# Patient Record
Sex: Female | Born: 1960 | Race: White | Hispanic: No | Marital: Married | Smoking: Never smoker
Health system: Southern US, Community
[De-identification: ages and names within clinical notes are randomized; demographics above are authoritative.]

## PROBLEM LIST (undated history)

## (undated) DIAGNOSIS — Q246 Congenital heart block: Secondary | ICD-10-CM

## (undated) DIAGNOSIS — Z950 Presence of cardiac pacemaker: Secondary | ICD-10-CM

## (undated) DIAGNOSIS — E039 Hypothyroidism, unspecified: Secondary | ICD-10-CM

## (undated) DIAGNOSIS — J45909 Unspecified asthma, uncomplicated: Secondary | ICD-10-CM

## (undated) DIAGNOSIS — I10 Essential (primary) hypertension: Secondary | ICD-10-CM

## (undated) DIAGNOSIS — I493 Ventricular premature depolarization: Secondary | ICD-10-CM

## (undated) HISTORY — DX: Unspecified asthma, uncomplicated: J45.909

## (undated) HISTORY — DX: Congenital heart block: Q24.6

## (undated) HISTORY — DX: Presence of cardiac pacemaker: Z95.0

## (undated) HISTORY — DX: Ventricular premature depolarization: I49.3

## (undated) HISTORY — PX: INSERT / REPLACE / REMOVE PACEMAKER: SUR710

## (undated) HISTORY — DX: Hypothyroidism, unspecified: E03.9

## (undated) HISTORY — DX: Essential (primary) hypertension: I10

---
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
## 2001-07-14 ENCOUNTER — Other Ambulatory Visit: Admission: RE | Admit: 2001-07-14 | Discharge: 2001-07-14 | Payer: Self-pay | Admitting: Internal Medicine

## 2001-08-22 ENCOUNTER — Ambulatory Visit (HOSPITAL_COMMUNITY): Admission: RE | Admit: 2001-08-22 | Discharge: 2001-08-23 | Payer: Self-pay | Admitting: Cardiology

## 2001-08-22 ENCOUNTER — Encounter: Payer: Self-pay | Admitting: *Deleted

## 2004-08-18 ENCOUNTER — Inpatient Hospital Stay (HOSPITAL_COMMUNITY): Admission: EM | Admit: 2004-08-18 | Discharge: 2004-08-22 | Payer: Self-pay | Admitting: Emergency Medicine

## 2004-08-18 ENCOUNTER — Ambulatory Visit: Payer: Self-pay | Admitting: Internal Medicine

## 2004-08-21 ENCOUNTER — Encounter: Payer: Self-pay | Admitting: Internal Medicine

## 2004-08-21 ENCOUNTER — Encounter (INDEPENDENT_AMBULATORY_CARE_PROVIDER_SITE_OTHER): Payer: Self-pay | Admitting: *Deleted

## 2004-09-14 ENCOUNTER — Encounter: Admission: RE | Admit: 2004-09-14 | Discharge: 2004-09-14 | Payer: Self-pay

## 2004-09-26 ENCOUNTER — Ambulatory Visit: Payer: Self-pay | Admitting: Internal Medicine

## 2004-10-19 ENCOUNTER — Ambulatory Visit (HOSPITAL_COMMUNITY): Admission: RE | Admit: 2004-10-19 | Discharge: 2004-10-19 | Payer: Self-pay

## 2004-11-01 ENCOUNTER — Ambulatory Visit: Payer: Self-pay | Admitting: Hematology & Oncology

## 2004-11-06 ENCOUNTER — Ambulatory Visit: Payer: Self-pay | Admitting: Internal Medicine

## 2004-11-21 ENCOUNTER — Encounter (INDEPENDENT_AMBULATORY_CARE_PROVIDER_SITE_OTHER): Payer: Self-pay | Admitting: Specialist

## 2004-11-21 ENCOUNTER — Encounter: Payer: Self-pay | Admitting: Hematology & Oncology

## 2004-11-21 ENCOUNTER — Ambulatory Visit (HOSPITAL_COMMUNITY): Admission: RE | Admit: 2004-11-21 | Discharge: 2004-11-21 | Payer: Self-pay | Admitting: Hematology & Oncology

## 2004-11-27 ENCOUNTER — Ambulatory Visit: Payer: Self-pay | Admitting: Hematology & Oncology

## 2004-12-26 ENCOUNTER — Ambulatory Visit: Payer: Self-pay | Admitting: Internal Medicine

## 2005-05-03 ENCOUNTER — Ambulatory Visit: Payer: Self-pay | Admitting: Internal Medicine

## 2005-05-08 ENCOUNTER — Ambulatory Visit (HOSPITAL_COMMUNITY): Admission: RE | Admit: 2005-05-08 | Discharge: 2005-05-08 | Payer: Self-pay | Admitting: Internal Medicine

## 2005-05-10 ENCOUNTER — Ambulatory Visit: Payer: Self-pay | Admitting: Internal Medicine

## 2005-06-28 ENCOUNTER — Inpatient Hospital Stay (HOSPITAL_COMMUNITY): Admission: AD | Admit: 2005-06-28 | Discharge: 2005-07-06 | Payer: Self-pay | Admitting: Internal Medicine

## 2005-06-28 ENCOUNTER — Ambulatory Visit: Payer: Self-pay | Admitting: Internal Medicine

## 2005-07-02 ENCOUNTER — Encounter (INDEPENDENT_AMBULATORY_CARE_PROVIDER_SITE_OTHER): Payer: Self-pay | Admitting: Cardiology

## 2005-07-02 ENCOUNTER — Encounter: Payer: Self-pay | Admitting: Internal Medicine

## 2005-07-10 ENCOUNTER — Ambulatory Visit: Payer: Self-pay | Admitting: Internal Medicine

## 2005-07-19 ENCOUNTER — Ambulatory Visit: Payer: Self-pay | Admitting: Internal Medicine

## 2005-07-22 ENCOUNTER — Inpatient Hospital Stay (HOSPITAL_COMMUNITY): Admission: EM | Admit: 2005-07-22 | Discharge: 2005-07-24 | Payer: Self-pay | Admitting: Emergency Medicine

## 2005-08-08 ENCOUNTER — Emergency Department (HOSPITAL_COMMUNITY): Admission: EM | Admit: 2005-08-08 | Discharge: 2005-08-08 | Payer: Self-pay | Admitting: Emergency Medicine

## 2005-08-10 ENCOUNTER — Inpatient Hospital Stay (HOSPITAL_COMMUNITY): Admission: RE | Admit: 2005-08-10 | Discharge: 2005-08-15 | Payer: Self-pay | Admitting: Surgery

## 2005-08-13 ENCOUNTER — Ambulatory Visit: Payer: Self-pay | Admitting: Infectious Diseases

## 2005-08-20 ENCOUNTER — Ambulatory Visit: Payer: Self-pay | Admitting: Internal Medicine

## 2005-09-03 ENCOUNTER — Ambulatory Visit: Payer: Self-pay | Admitting: Internal Medicine

## 2005-09-04 ENCOUNTER — Encounter: Admission: RE | Admit: 2005-09-04 | Discharge: 2005-09-04 | Payer: Self-pay | Admitting: Surgery

## 2005-09-10 ENCOUNTER — Ambulatory Visit: Payer: Self-pay | Admitting: Internal Medicine

## 2005-09-27 ENCOUNTER — Observation Stay (HOSPITAL_COMMUNITY): Admission: EM | Admit: 2005-09-27 | Discharge: 2005-09-28 | Payer: Self-pay | Admitting: Emergency Medicine

## 2007-01-19 IMAGING — CR DG CHEST 2V
2 series · 2 of 2 positions shown · non-contrast
Comparison: none

CLINICAL DATA: Fever.  Asthma.  Cough.
 CHEST - 2 VIEWS:   
 Two views of the chest are compared to a chest x-ray of 08/22/01.  The lungs are clear.   There is some peribronchial thickening consistent with bronchitis.  The heart is mildly enlarged and a permanent pacemaker remains.  Disconnected epicardial leads remain.

[w chest pa]
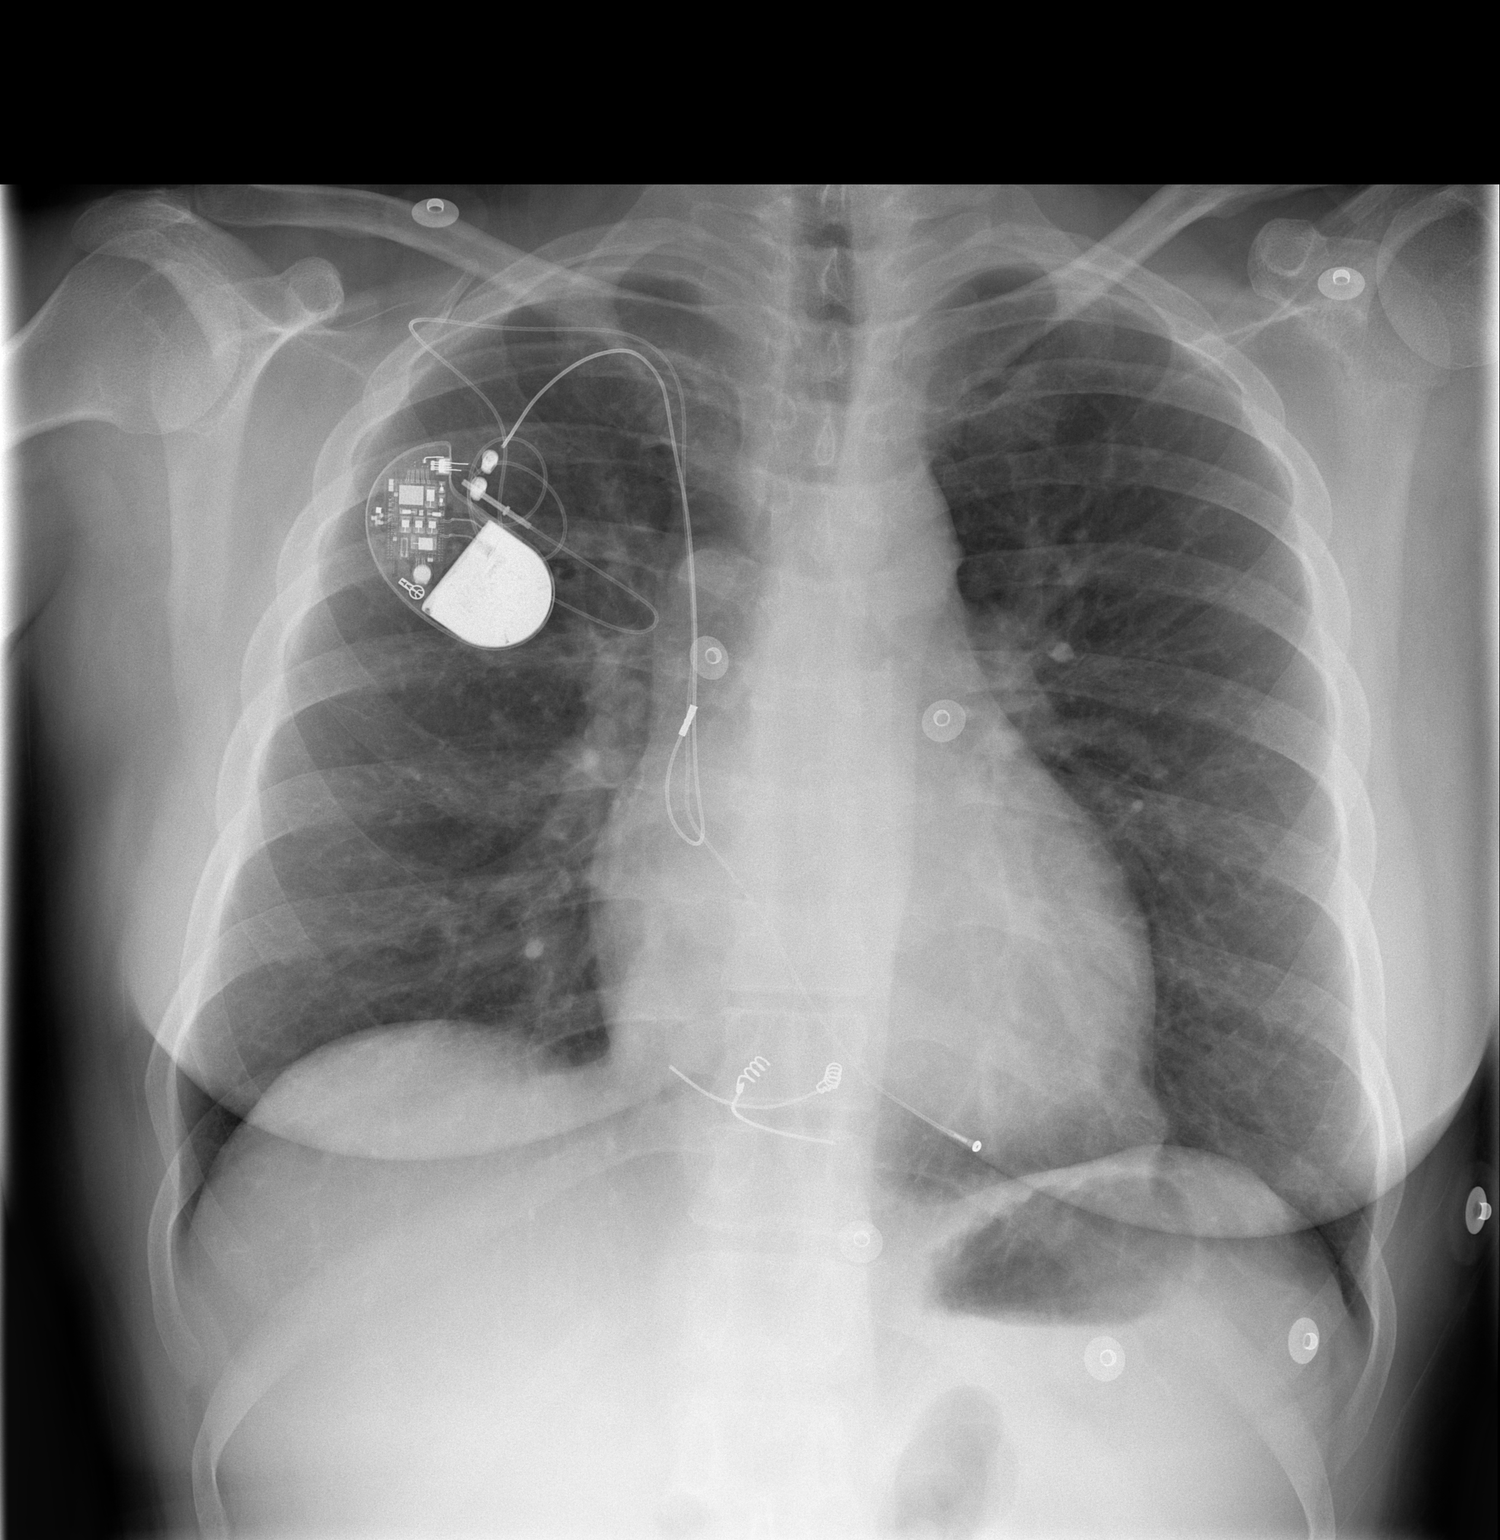

[w chest lat]
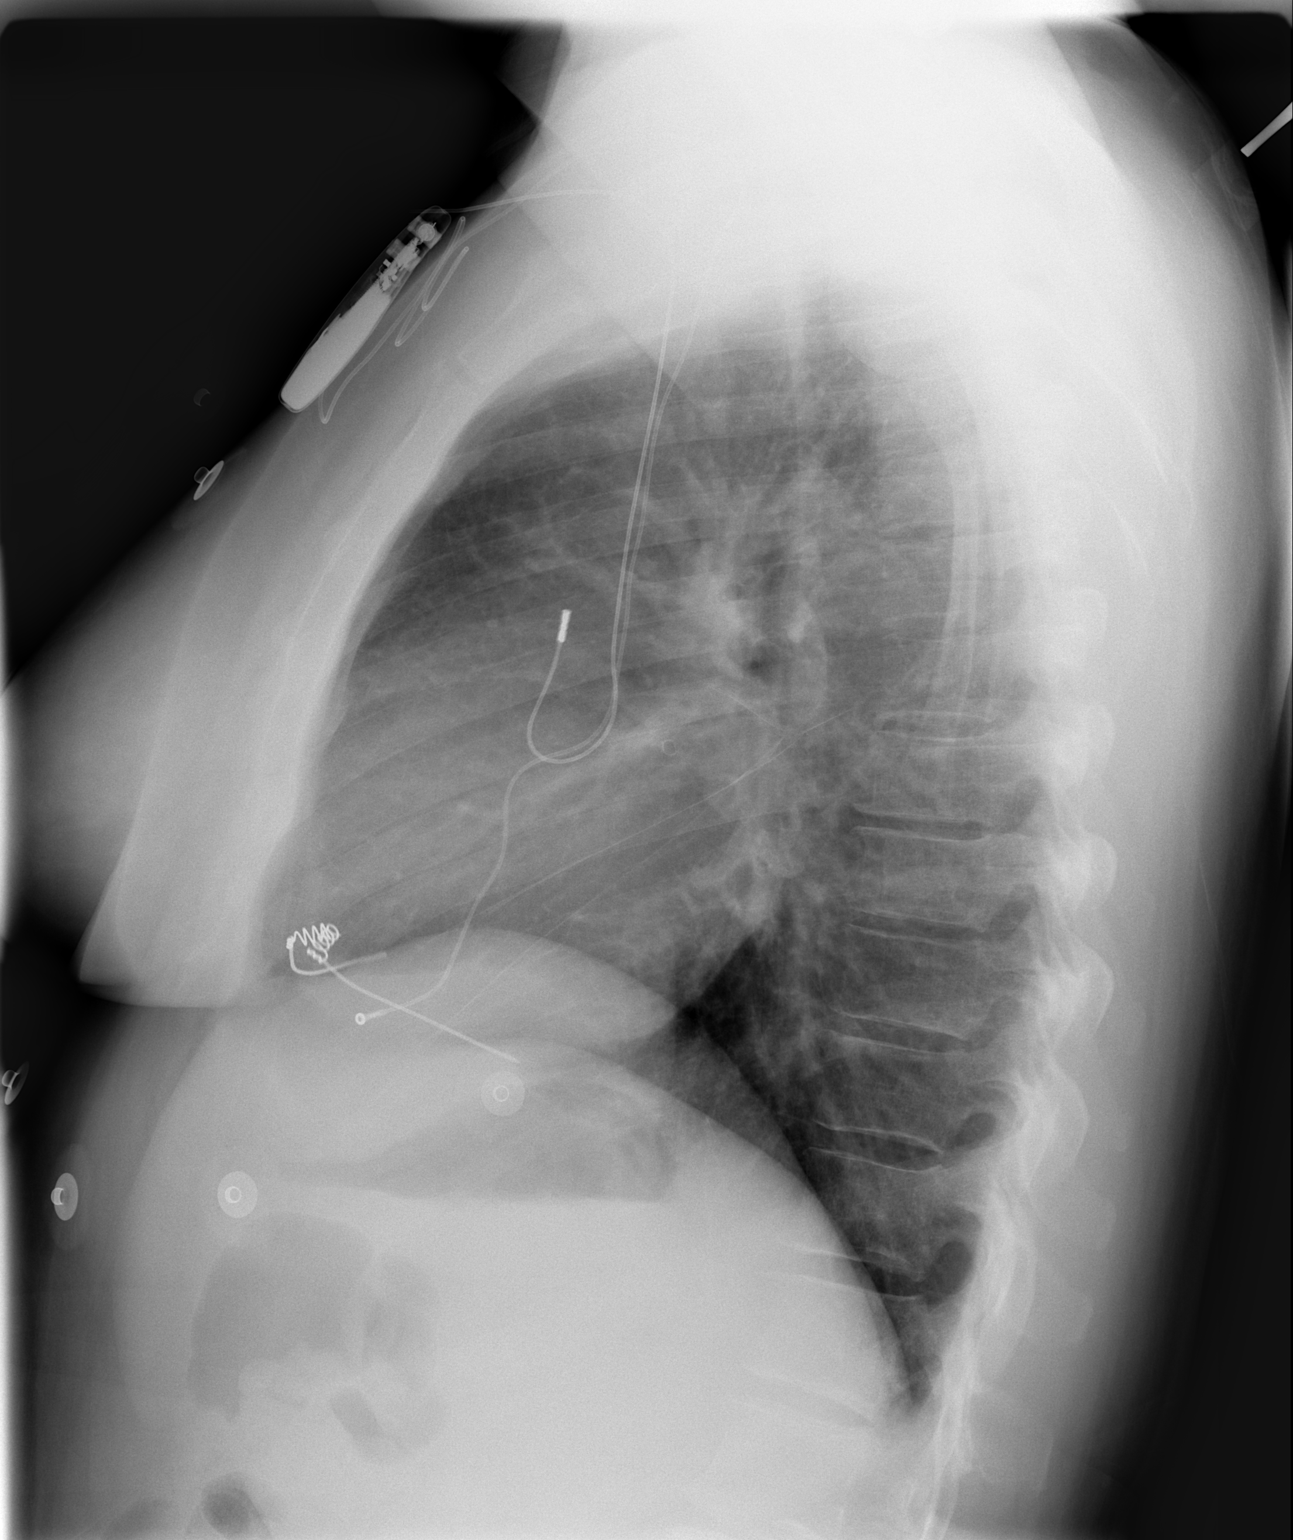

[2 of 2 positions shown; findings below may reference images not displayed]

IMPRESSION: No active lung disease.   Question bronchitis.  Permanent pacer remains.

## 2007-01-22 IMAGING — US US ABDOMEN LIMITED
1 series · 5 of 5 positions shown · non-contrast
Comparison: none

CLINICAL DATA: Fever.  Palpable spleen.  
 LIMITED ABDOMEN ULTRASOUND:
TECHNIQUE: Limited abdominal ultrasound examination was performed to evaluate the spleen. 
 Multiple scans of the spleen show the spleen to measure 13.0 x 8.3 x 8.6 cm which corresponds to a volume of 486 cubic centimeters which is at the upper limits of normal and a higher volume than is usually seen with the spleen since the spleen is more spherical in contour than is usually seen.  No definite mass or irregularity of transmission of sound is seen within the spleen.   There are no granulomata present.

[Series 1: unknown · 0.33mm/px · 5 of 5 slices shown]
[im 1/5]
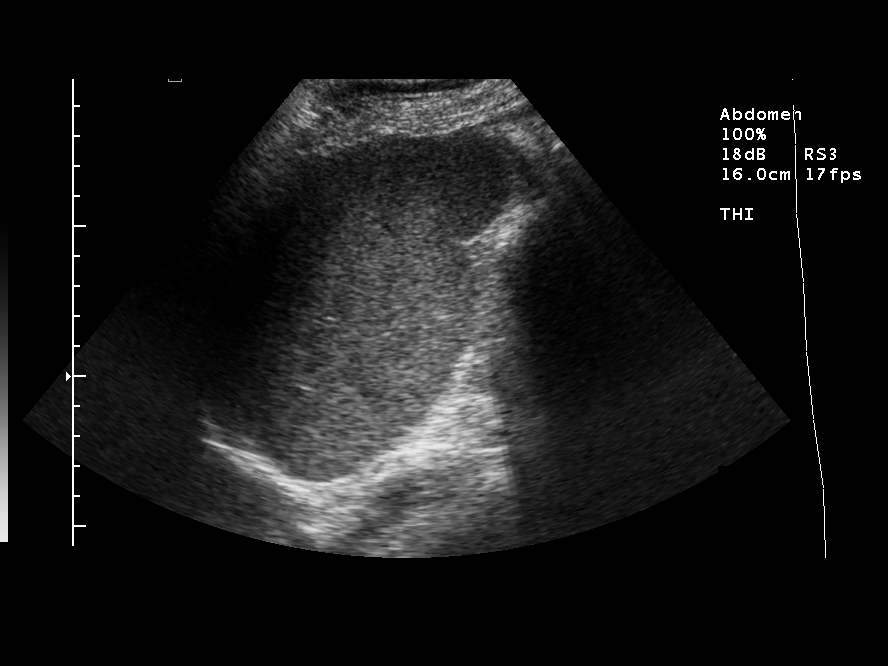
[im 2/5]
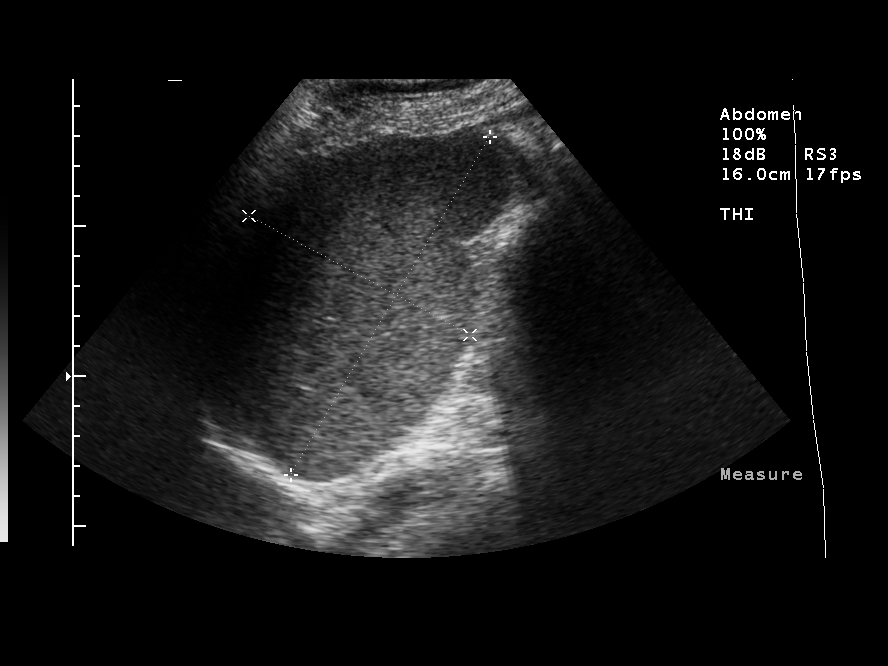
[im 3/5]
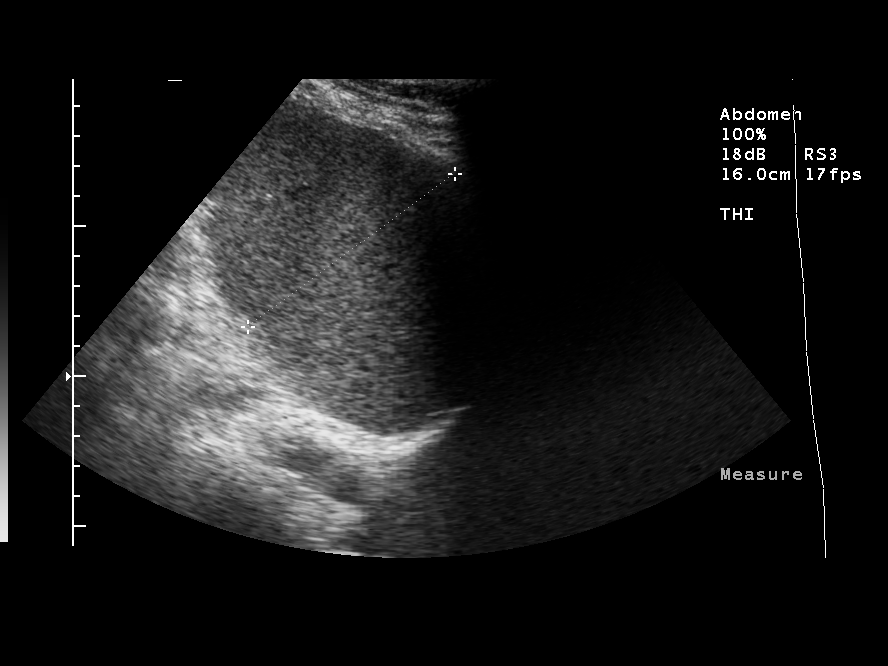
[im 4/5]
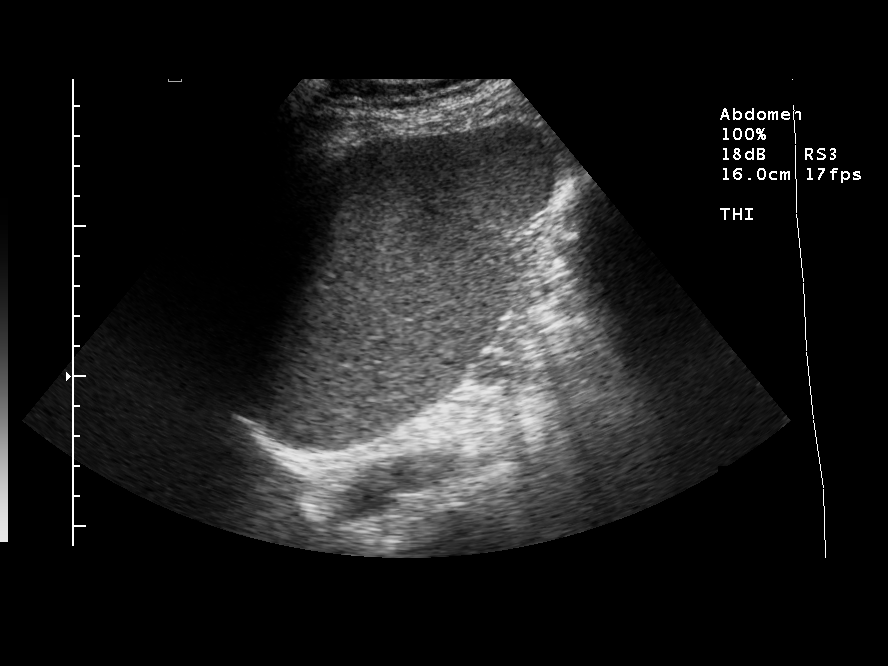
[im 5/5]
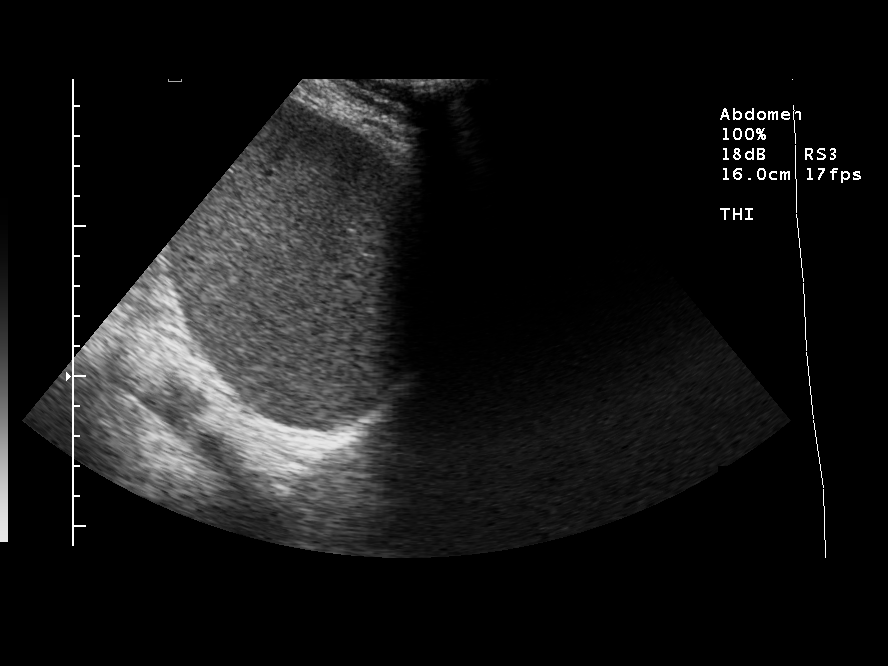

[5 of 5 positions shown; findings below may reference images not displayed]

IMPRESSION: Spleen at the upper limits of normal for size.  No focal abnormality within the spleen.

## 2009-03-16 ENCOUNTER — Encounter: Payer: Self-pay | Admitting: Internal Medicine

## 2009-08-02 ENCOUNTER — Encounter: Payer: Self-pay | Admitting: Internal Medicine

## 2009-10-04 ENCOUNTER — Telehealth (INDEPENDENT_AMBULATORY_CARE_PROVIDER_SITE_OTHER): Payer: Self-pay | Admitting: *Deleted

## 2009-10-06 ENCOUNTER — Ambulatory Visit: Payer: Self-pay | Admitting: Internal Medicine

## 2009-10-06 DIAGNOSIS — I4949 Other premature depolarization: Secondary | ICD-10-CM | POA: Insufficient documentation

## 2009-10-06 DIAGNOSIS — Z950 Presence of cardiac pacemaker: Secondary | ICD-10-CM

## 2009-10-06 DIAGNOSIS — E039 Hypothyroidism, unspecified: Secondary | ICD-10-CM | POA: Insufficient documentation

## 2010-03-16 NOTE — Progress Notes (Signed)
  Pt Records recieved from [facility] gave to [place]. Cala Bradford Mesiemore  October 04, 2009 8:13 AM

## 2010-03-16 NOTE — Letter (Signed)
Summary: [facility] Extended Visit   [facility] Extended Visit   Imported By: Roderic Ovens 01/27/2010 16:14:52  _____________________________________________________________________  External Attachment:    Type:   Image     Comment:   External Document

## 2010-03-16 NOTE — Letter (Signed)
Summary: Echo, Ekg, Cath Report   Echo, Ekg, Cath Report   Imported By: Roderic Ovens 01/27/2010 16:18:22  _____________________________________________________________________  External Attachment:    Type:   Image     Comment:   External Document

## 2010-03-16 NOTE — Letter (Signed)
Summary: [facility]/Revision Data Sheet   [facility]/Revision Data Sheet   Imported By: Roderic Ovens 01/27/2010 16:13:12  _____________________________________________________________________  External Attachment:    Type:   Image     Comment:   External Document

## 2010-03-16 NOTE — Cardiovascular Report (Signed)
Summary: Office Visit   Office Visit   Imported By: Roderic Ovens 10/11/2009 16:31:00  _____________________________________________________________________  External Attachment:    Type:   Image     Comment:   External Document

## 2010-03-16 NOTE — Assessment & Plan Note (Signed)
Summary: pacemaker/hypertension/mt   Visit Type:  Initial Consult Primary [name]:  Candida Peeling, MD   History of Present Illness: [name] returns today for followup.  She is a pleasant 50 yo woman with congenital complete heart block s/p PPM.  The patient developed a PM infection (endovascular) several yrs ago which was initially very difficult to diagnose.  She ultimately underwent lead extraction and insertion of a new device with epicardial leads.  She has been stable in the interim except that she has some pain at her PPM insertion site.  No fever/chills/night sweats.  Current Medications (verified): 1)  Synthroid 88 Mcg Tabs (Levothyroxine Sodium) .... Once Daily 2)  Metoprolol Tartrate 100 Mg Tabs (Metoprolol Tartrate) .... Take 1/2tablet By Mouth Once Daily 3)  Keflex 500 Mg Caps (Cephalexin) .... As Needed Dental Work  Allergies (verified): 1)  ! * Pcn 2)  ! Erythromycin 3)  ! Doxycycline 4)  ! Zithromax 5)  ! * Anspor 6)  ! Septra  Past History:  Past Medical History: Last updated: 10/06/2009 Current Problems:  PACEMAKER, PERMANENT (ICD-V45.01) PREMATURE VENTRICULAR CONTRACTIONS (ICD-427.69) UNSPECIFIED HYPOTHYROIDISM (ICD-244.9) COPD (ICD-496) CAD (ICD-414.00) CORONARY ARTERY BYPASS GRAFT, HX OF (ICD-V45.81)    Review of Systems  The patient denies chest pain, syncope, dyspnea on exertion, and peripheral edema.    Vital Signs:  Patient profile:   50 year old female Height:      53 inches Weight:      190 pounds BMI:     47.73 Pulse rate:   72 / minute BP sitting:   138 / 84  (left arm)  Vitals Entered By: Laurance Flatten CMA (October 06, 2009 1:55 PM)  Physical Exam  General:  Well developed, well nourished, in no acute distress.  HEENT: normal Neck: supple. No JVD. Carotids 2+ bilaterally no bruits Cor: RRR no rubs, gallops or murmur Lungs: CTA. Well healed sternotomy incision. Ab: soft, nontender. nondistended. No HSM. Good bowel sounds. Well healed  PM incision. Ext: warm. no cyanosis, clubbing or edema Neuro: alert and oriented. Grossly nonfocal. affect pleasant    PPM Specifications Following MD:  [name], MD     PPM Vendor:  [employer]     PPM Model Number:  [id number]     PPM Serial Number:  [id number] PPM DOI:  08/10/2005      Lead 1    Location: RA     DOI: 08/10/2005     Model #: [id number]     Serial #: [id number]     Status: active Lead 2    Location: RV     DOI: 08/10/2005     Model #: [id number]     Serial #: [id number]     Status: active   Indications:  abd implant   PPM Follow Up Battery Voltage:  2.78 V     Battery Est. Longevity:  2-2.75 YRS     Pacer Dependent:  Yes       PPM Device Measurements Atrium  Amplitude: 1.1 mV, Impedance: 308 ohms, Threshold: 0.50 V at 0.4 msec Right Ventricle  Amplitude: PACED 368 mV, Impedance: 368 ohms, Threshold: 1.25 V at 0.4 msec  Episodes MS Episodes:  238     Percent Mode Switch:  <1%     Ventricular High Rate:  0     Atrial Pacing:  58%     Ventricular Pacing:  >99%  Parameters Mode:  DDDR     Lower Rate Limit:  70     Upper  Rate Limit:  130 Paced AV Delay:  170     Sensed AV Delay:  150 Next Cardiology Appt Due:  03/15/2010 Tech Comments:  PACER DEPENDENT---NORMAL DEVICE FUNCTION.  NO CHANGES MADE.  ROV IN 6 MTHS W/DEVICE CLINIC. Vella Kohler  October 06, 2009 2:21 PM MD Comments:  agree with above.  Impression & Recommendations:  Problem # 1:  PACEMAKER, PERMANENT (ICD-V45.01) Her device is working normally.  Her RV threshold is elevated a bit.  Will follow.  Patient Instructions: 1)  Your physician recommends that you schedule a follow-up appointment in: 6 months with Dr [name]

## 2010-06-30 NOTE — H&P (Signed)
NAMEDAYSHIA, [name]              ACCOUNT NO.:  [id number]   MEDICAL RECORD NO.:  [id number]          PATIENT TYPE:  INP   LOCATION:  1824                         FACILITY:  [facility]   PHYSICIAN:  Isidor Holts, M.D.  DATE OF BIRTH:  [birth year]   DATE OF ADMISSION:  08/18/2004  DATE OF DISCHARGE:                                HISTORY & PHYSICAL   PRIMARY MEDICAL DOCTOR:  Dr. Gardiner Barefoot. Renne Crigler   CHIEF COMPLAINT:  Recurrent fever/chills for approximately 1 week.   HISTORY OF PRESENT ILLNESS:  This is a 50 year old female, with known  history of hypothyroidism, congenital heart block. Status post multiple  procedures for fractured/failed pacer.  She had fever and chills while at a  meeting on 08/09/2004.  According to her, she became short of breath and  turned blue.  The episode passed.  Since then she has had daily episodes  of shaking chills and fever associated with shortness of breath and cough  productive of phlegm, although she does not know the color of the phlegm as  she swallows it.  On 08/13/2004 [facility]  where she was evaluated by Dr. Merla Riches.  She had, according to her, a full  work-up including urinalysis, chest x-ray, blood cultures and apparently had  a temperature of 102.7 at that time.  Results of the above investigations  are still pending. Renal ultrasound was arranged, as the patient's  urinalysis apparently showed hematuria.  This was done on 08/17/2004, but  the results are not known.  The patient presented to her PMD's office on  08/17/2004 due to persisting symptoms. She was seen by the nurse  practitioner and apparently a TEE was arranged.  This has not yet been done.  She finally went to [facility] on 08/17/2004 where she  was seen by Dr. Renne Crigler and was referred for admission, workup, and treatment.  On detailed questioning, according to the patient, way back in April 2006  she had a cold which resolved  subsequently soon after.  While in [place],  about that time, she had her first chill.  This also soon resolved.  In  May 2006 she had dental work, which involved placing a crown and at that  time, she was placed on antibiotics, for some unclear reason, possibly for  endocarditis prophylaxis and had further chills.  Work-up was done  subsequently by Dr. Adelene Idler nurse practitione, and, according to the  patient, urinalysis showed microhematuria, but as she had started her menses  2 days prior, this was felt to be secondary to contamination. She has had no  further problems or episodes of fever or chills until 08/09/2004.   PAST MEDICAL HISTORY:  1.  Chronic sinus problems associated with nasal congestion, post nasal      drip.  2.  History of congenital heart block, status post permanent pacer at age      13+years.  Pacer leads were fractured/replaced 08/1976 (required 3 procedures).  Pacer was replaced in 1982 by Drs. Tysinger/Bowman.  The patient is status post removal of old pacer wires, 08/1981.  Failed pacer 1990. Battery was changed.  Failed atrial pacer lead in 2003, this was removed.  The patient now has  ventricular pacing.  1.  Bronchial asthma, mild intermittent.  2.  Hypothyroidism in 1991 now on replacement treatment.  3.  Status post tonsillectomy in 1971.   MEDICATIONS:  1.  Synthroid 88 mcg alternating with 100 mcg.  2.  Antacids over-the-counter.   ALLERGIES:  The patient has multiple antibiotic allergies including  ERYTHROMYCIN, PENICILLIN, DOXYCYCLINE, ANSPOR, SEPTRA.  She, however, tolerates Keflex.   SYSTEMS REVIEW:  Essentially as in the HPI and chief complaint. She gets  occasional leg cramps.   SOCIAL HISTORY:  Otherwise negative.  LMP 08/03/2004.   FAMILY HISTORY:  The patient is married, is a nonsmoker, nondrinker. Has no  history of drug abuse. She has half-siblings, but she does not know what  their health status is and she also does not know her  father's health  history.  Her mother is living and has had an MI prior to age 39 years.  She  is hypertensive, has renal problems, and is obese.  Family history is  otherwise noncontributory.   PHYSICAL EXAMINATION:  VITAL SIGNS:  Temperature 101.3, pulse 70/min,  regular; respiratory rate 24, blood pressure 140/60 mmHg.  Pulse oximetry  98% on room air.  GENERAL:  The patient does not appear to be in obvious acute distress.  She  is alert, acutely short of breath at rest.  HEENT:  No clinical pallor, no jaundice, no conjunctival injection. Throat  is quite clear.  The patient has tenderness to pressure over the maxillary  sinuses bilaterally.  NECK:  Supple.  JVP not seen. No palpable lymphadenopathy, no palpable  goiter.  CHEST:  Clinically clear to auscultation. No wheezes, no crackles.  HEART:  Sounds normal, regular, no murmurs.  Pacer is noted in right  infraclavicular pouch with overlying scars.  ABDOMEN:  Full, soft and nontender.  There is no palpable organomegaly, no  palpable masses, normal bowel sounds.  EXTREMITIES:  Lower extremity examination no pitting edema or palpable  peripheral pulses.   INVESTIGATIONS:  CBC showed WBC 11.1, hemoglobin 10.6, hematocrit 32.2,  platelets 195.  Electrolytes sodium 135, potassium 3.7, chloride 109, CO2  24, BUN 8, creatinine 0.9, glucose 95.  Urinalysis shows rbc's 3-6, negative  leukocyte esterase, negative nitrites, wbc's not done, bacteria many.  Chest  x-ray shows probable bronchitic changes.  Permanent pacer is in situ,  otherwise no abnormality is seen.  EKG shows paced complexes.   ASSESSMENT AND PLAN:  1.  Pyrexia query cause.  The patient has chronic sinus problems, maxillary      sinus tenderness, occasional cough with phlegm production.  Chest x-ray      shows bronchitic changes.  Urinalysis shows microhematuria with     bacteruria.  These may be the likely cause of the patient's pyrexia and      chills over the past 1  week.  The 2 episodes of chills with and without      fever in April-May 2006 may well be a red herring.  We will send out the      blood cultures x3, do a paranasal sinus CT scan for completeness and      also a chest CT to rule out pulmonary embolism.  Meanwhile we will start      the patient on Avelox which should be adequate coverage for respiratory      tract infection, including sinusitis  and also urinary tract infection.      Dr. Renne Crigler feels that endocarditis should be a consideration given the      patient's background history. Certainly, this possibility has to be      entertained.  We will, therefore, contact Dr. Aleen Campi for possible TEE      particularly, as I understand that this may already have been arranged.      Dr. Adelene Idler office has been contacted and consultation requested      accordingly.   1.  History of congestive heart block status post permanent pacer, per EKG      this seems to be functioning quite adequately.  We shall continue to      monitor.   1.  History of mild intermittent bronchial asthma.  This is not troublesome      at present.  We will manage her with p.r.n. bronchodilator nebulizers.   1.  Hypothyroidism. We will check TSH levels and continue replacement      treatment with pre-admission synthroid dosage.  Further management will depend on clinical course.       CO/MEDQ  D:  08/18/2004  T:  08/18/2004  Job:  [id number]   cc:   Antionette Char, MD  [address]  [place]  [place] [address]  Fax: [phone]   [facility]  [address]  [place]  [place] [address]  Fax: [phone]

## 2010-06-30 NOTE — Discharge Summary (Signed)
NAMEJALEIA, HANKE              ACCOUNT NO.:  [id number]   MEDICAL RECORD NO.:  [id number]          PATIENT TYPE:  INP   LOCATION:  3739                         FACILITY:  [facility]   PHYSICIAN:  Yetta Barre, M.D.  DATE OF BIRTH:  11-28-1960   DATE OF ADMISSION:  06/28/2005  DATE OF DISCHARGE:  07/06/2005                                 DISCHARGE SUMMARY   DISCHARGE DIAGNOSES:  1.  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  2.  Hypothyroidism.  3.  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  4.  History of fever of unknown origin in July of 2006 with 1 out of 4 blood      cultures positive for coagulase negative Staphylococcus organism.  5.  History of pacemaker placed when patient was 50 years of age secondary      to congenital heart block.  6.  History of bronchial asthma.   DISCHARGE MEDICATIONS:  1.  Vancomycin intravenous for 5 more weeks, to be done by [facility].  Vancomycin dosage to be adjusted according to vancomycin      trough levels.  2.  Prednisone 7.5 mg p.o. daily.  3.  Synthroid 88 mcg p.o. daily.  4.  Of note, patient needs Benadryl 25 mg p.o. before every vancomycin      treatment.   FOLLOWUP:  Patient is to followup with Dr. Orvan Falconer, of Infectious Disease,  and Dr. Phylliss Bob, rheumatologist.  The patient is also to followup with Dr.  Aleen Campi, her cardiologist.  The patient said she would call the above  offices for appointments.   CONSULTANTS THIS ADMISSION:  1.  Dr. Orvan Falconer, Infectious Disease.  2.  Dr. Aleen Campi, cardiologist.  3.  Dr. [name].  4.  Dr. Tyrone Sage.   IMAGES DONE THIS ADMISSION:  1.  Chest x-ray done on Jun 29, 2005, shows stable pacer, mild cardiac      prominence but no CHF or edema.  2.  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  3.  Transesophageal  echocardiogram done on Jul 02, 2005, by Dr. Jacinto Halim      showing overall left ventricular systolic function was normal.  There      was no atheroma of the aortic arch.  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  Mildly positive right-to-left shunting.  There was the      appearance of a catheter or pacing wire in the right ventricle.      Fenestrated mobile multiple masses, small in size, very suspicious for      vegetations.  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  Moderate pulmonary hypertension.  There was the      appearance of a catheter or pacing wire in the right atrium.   COMPARISONS:  Compared to the previous study done on August 21, 2004, there  was a small  mobile mass attached to the tricuspid valve leaflet on the  atrial side.  Very suspicious for vegetations.  The mobile masses were noted  previously (1-2 only) but appear to be more in number.  Tricuspid  regurgitation appears to be moderately severe compared to moderate  previously.   PROCEDURES DONE THIS ADMISSION:  PEG placement on Jun 29, 2005, under  interventional radiology.   BRIEF HISTORY AND PHYSICAL:  Please see medical records for further details.  Ms. Faughnan is a 50 year old white woman with an unfortunate past medical  history of congenital heart block requiring pacemaker to be placed when she  was 50 years old.  She is has had a series of complications/interventions up  until 2003, for failed atrial lead pacers.  (Five times.)  She presented on  Jun 28, 2005, for inpatient IV antibiotic therapy after being followed as an  outpatient for fever of unknown origin by Dr. Orvan Falconer.  Patient has been  evaluated by Rheumatology, Dr. Phylliss Bob.  Her regular cardiologist is Dr.  Aleen Campi.  Most recently patient was sent to [facility] for a second  opinion regarding fever of unknown origin.  She apparently grew coagulase  negative Staph organisms on four blood cultures there.  These  blood cultures  were done one week prior to admission here.  Patient has been on chronic  prednisone therapy since January of 2007, and has apparently had some  response to blunting of her fevers with this therapy. Patient reports  episodes of mild nausea with sudden onset of smirk-like appearance and  chills along with her fever.  Patient denies any chest pain, shortness of  breath with this history.  Of note, the patient has had recent diagnostic  testing for hypothyroidism and was __________ , TSH was decreased, and  recent change in thyroid medication was made that is from 100 mcg daily to  88 mcg daily.   ALLERGIES:  1.  THE PATIENT IS ALLERGIC TO ERYTHROMYCIN, SHE DEVELOPS NAUSEA AND      VOMITING.  2.  PENICILLIN.  SHE HAD A RASH AT AGE 41.  3.  DOXYCYCLINE.  PATIENT COMPLAINED OF FAINT RASH ON LEFT THUMB AND CHEEK.  4.  SEPTRA.  PATIENT HAS HIVES AND FEVER.  5.  ZITHROMAX.  PATIENT COMPLAINS OF SCALP ITCHING.  6.  AVELOX.  PLEURITIC RASH AROUND IV SITE.  7.  ANSPAR.   PAST MEDICAL HISTORY:  1.  Pacemaker placed at age 50 secondary to congenital heart block, history      of multiple replacement surgeries for failed atrial lead pacers (5      times).  2.  History of pyrexia of unknown origin in July of 2006.  One out of 4      blood cultures were found to be positive coagulase staph organisms,      thought to be a contaminant at that time.  TEE done during that      admission shows lateral tricuspid ring attached to septal leaflet.      Questionable echodensities positive on atrial surface of septal leaflet      suggested vegetation but they do not meet criteria based on size.  3.  History of four blood cultures positive at [facility] for staph      organisms coagulase negative.  Done one week prior to admission.  4.  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  5.  Bone marrow biopsy done on September, 2006, was nonspecific, done by Dr.  Ennever.  6.  Abdominal ultrasound done in March of 2007 and pelvic CT scan showed      spleen size upper limits of normal and unchanged from July of 2007.  7.  PET scan done in 2007 shows splenomegaly with increased activity in      spleen and bone marrow.  8.  Cystoscopy done on October, 2006, was normal.  This was done for      microscopic hematuria.  9.  History of hypothyroidism with a low TSH of 0.046 and free T3 and T4      within normal limits.  Patient's Synthroid was changed from 100 mcg      daily to 88 mcg daily two weeks prior to admission.   MEDICATIONS:  1.  Prednisone 10 mg p.o. daily.  2.  Synthroid 88 mcg p.o. daily.   SUBSTANCE HISTORY:  Patient does not smoke, does not drink alcohol or has no  history of drug abuse.   SOCIAL HISTORY:  Patient is married.  She lives with her husband in  [place] and has [employer].   FAMILY HISTORY:  Mother had an MI at 75 years of age and has hypertension  and some renal disease.  Medical history of father is unknown.  Patient has  half-siblings and their health status is unknown.   CHILDREN:  None.   PHYSICAL EXAMINATION:  Temperature 98.7, blood pressure 127/89, pulse 71,  respiratory rate of 22 and O2 sats 97% on room air.  Weight 157.  GENERAL APPEARANCE:  The patient did not appear in any acute distress.  EYES:  Pupils equal, round, reactive to light.  Extraocular movements  intact.  ENT:  Oropharynx clear, no exudates or erythema.  NECK:  Supple, no adenopathy.  LUNGS:  Clear to auscultation bilaterally, no wheezes or rhonchi.  HEART:  Pacemaker sounds.  Surgical scars seen on right upper chest and just  above epigastric region.  ABDOMEN:  Soft, nondistended, nontender.  Bowel sounds present.  EXTREMITIES:  No edema.  Slightly cold bilateral palms.  Pulses 2+  bilaterally.  SKIN:  No rashes.  NEUROLOGICAL:  Alert and oriented x3, grossly nonfocal site.  Patient  appears calm sometimes and other  times a bit anxious.   LABORATORY DATA:  Blood cultures drawn on Jun 28, 2005, x2 turned out to be  negative on final report today.  Hemoglobin 11.5, hematocrit 34.7, MCV 79.4,  white cell 11.9, platelets 189, sodium 136, potassium 3.5, chloride 101,  bicarbonate 27, BUN 10, creatinine 1.0 and glucose of 101.  Total bilirubin  of 1.2, alkaline phosphatase of 74, AST 17, ALT 16, total protein 7.0,  albumin 3.8, calcium 8.6.  PT 14.4, PTT 36, INR 1.1.   HOSPITAL COURSE:  1.  Coagulase-negative Staphylococcus bacteremia seen on blood cultures at      [facility] on two separate occasions, all positive (four lab draws      were done).  Patient had a repeat transesophageal echocardiogram done by      Dr. Jacinto Halim on Jul 02, 2005.  Results of TEE are as above.  Dr. Orvan Falconer      was involved with the care of this patient.  Despite tremendous      uncertainty in this case, Dr. 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  Dr. Orvan Falconer  discussed the negative blood cultures here in [facility] and the      positive blood cultures at [facility].  He was under the impression that it      could be secondary to the volume of blood inoculated relative to      predicted sensitivity of the test and also that the [facility]      may be using a different blood culture system that potentially increases      the detection of __________ organism.  Since the infection was thought      to be secondary to patient's pacemaker lead, Dr. Aleen Campi, the patient's      cardiologist, was called by Dr. Orvan Falconer.  Dr. Aleen Campi presented Ms.      Ewan with the possibilities of different procedures.  He recommended      open heart procedure with removal of pacer leads.  Tricuspid valve      replacement and placement of epicardial lead for pacing.  Patient was      undecided on  whether she would want to go on with the procedure.      Therefore, Dr. [name] and Dr. Jaynie Collins were called to discuss management      with the patient as well.  Their recommendations were to attempt      intervascular removal of pacemaker lead and if unable to do that to      proceed with sternotomy and possible place new epicardial system.  The      patient was very anxious and was unable to decide on the above two      recommendations by Dr. Aleen Campi and Dr. Tyrone Sage and Dr. [name].  She      said that she would like some time in order to decide on which procedure      she would like.  She was encouraged to seek an opinion at [facility] by Dr.      [name] for the same.  Patient will now be sent home on intravenous      vancomycin therapy to be given for another five weeks (total of 6      weeks).  Patient will be followed by Dr. Orvan Falconer for the same.      [facility] for intravenous vancomycin      therapy and vancomycin protocol will also be employed.  Of note, the      patient had some mild fevers on the first day of admission and after      that she has not had any.  2.  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  The patient is under workup by Dr.      Chase Picket.  She was continued on her 10 mg of prednisone for the first      few days after admission.  Dr. Orvan Falconer spoke with Dr. Phylliss Bob regarding      the same and tapering of her steroids had been started.  The patient      currently on prednisone 7.5 mg p.o. daily and will continue the same      dose until she sees Dr. Phylliss Bob in followup.  3.  Hypothyroidism.  Patient is currently on Synthroid 88 mcg p.o. daily.      Patient reports taking 100 mcg two weeks prior to the change.  On lab      test done at Dr. Renaldo Reel office, TSH was found to be 0.046 and free T3  and T4 were normal.  On repeat thyroid function test done here in [facility] showed a TSH of 0.504, free T4 of 1.18 and T3 of 2.5, all within      normal limits.  Therefore, patient needs to continue the same dose of      Synthroid 88 mcg p.o. daily on discharge.   DISCHARGE VITALS:  Temperature 99.1, pulse 81, respiratory rate 18, blood  pressure 115/87, O2 sats 97% on room air.      Yetta Barre, M.D.     SS/MEDQ  D:  07/06/2005  T:  07/06/2005  Job:  [id number]   cc:   Cliffton Asters, M.D.  Fax: [phone]   Areatha Keas, M.D.  Fax: [phone]   Doylene Canning. [name], M.D.  [address]  [address]  [place]  [place] [address]   Sheliah Plane, MD  [address]  [place]  [place] [address]   Antionette Char, MD  Fax: [phone]

## 2010-06-30 NOTE — Discharge Summary (Signed)
[name], [name]              ACCOUNT NO.:  [id number]   MEDICAL RECORD NO.:  [id number]          PATIENT TYPE:  INP   LOCATION:  3708                         FACILITY:  [facility]   PHYSICIAN:  Lonia Blood, M.D.      DATE OF BIRTH:  July 26, 1960   DATE OF ADMISSION:  08/18/2004  DATE OF DISCHARGE:                                 DISCHARGE SUMMARY   DISCHARGE DIAGNOSES:  1.  Intermittent persistent fever now stabilized.  2.  History of bronchial asthma and multiple allergies.  3.  Hypothyroidism.  4.  History of congenital heart block with pacemaker in place.  5.  Chronic sinusitis.   DISCHARGE MEDICATIONS:  1.  Synthroid 0.88 mg p.o. daily.  2.  Nasonex nasal spray.  3.  Over-the-counter antacids.   DISPOSITION:  Patient is being discharged in good health.  She came in with  episodes of intermittent fevers that lasted about a week but now they are  stabilized.  Patient has not had a fever since Friday which is four days  ago.  She will follow up with Dr. Renne Crigler and Dr. Aleen Campi.  She has been  informed that her problem may be vasculitic related if it recurs during the  day.  She may want to give a call to Dr. Phylliss Bob and see if he wants to do a  vasculitic work-up.  I also suspect that patient's episodes of fever could  be premenopausal, could be hormonal in nature.  I have advised her to have a  visit to OB/GYN and have that checked.  She is 50 and she could be very  close to her menopause which could be from 57 to 46 years old.   PROCEDURES PERFORMED:  1.  Chest x-ray performed on August 18, 2004, shows no active lung disease.      There was question of bronchitis.  Permanent pacer in place.  2.  Chest CT performed without contrast performed on August 18, 2004, also      showed no evidence of pulmonary embolism, no mediastinal or hilar      adenopathy.  3.  CT maxillofacial performed on August 18, 2004, showed mild mucosal      thickening in maxillary sinuses but no sinusitis.  4.  An  abdominal ultrasound performed on August 21, 2004, to check spleen      showed spleen at the upper limits of normal for size but no focal      abnormality within the spleen.  5.  A 2-D echo also performed on August 21, 2004, shows normal EF at 55 to      65%, no diagnostic evidence of left ventricular regional wall      abnormalities.  Her pacer is still in place.  The SVC is largely      obstructed by multiple pacer leads and surrounding fibrosis with only a      small relatively high velocity jet lumen present.  No diagnostic      echocardiographic evidence for valvular vegetation.  6.  TEE also performed on August 21, 2004, showed no definitive evidence of  vegetation.   CONSULTATIONS:  1.  [facility], Dr. Jacinto Halim.  2.  Infectious disease, Dr. Cliffton Asters.   BRIEF HISTORY AND PHYSICAL:  Patient is a 50 year old white female with  known history of hypothyroidism and also congenital heart disease on a  pacemaker.  Patient came in due to intermittent fever and chills that has  been going on for about a week. She has apparently been to urgent care a  couple of times but no relief.  She was also having fever at home but she  could not explain why.  When she was seen as an outpatient, she had full  work-up including chest x-ray, urinalysis, blood cultures, etc.  Her  temperature was as high as 102.7.  She was also seen by Dr. Adelene Idler nurse  practitioner, at which time all she had was microhematuria.  On admission,  patient's vitals were stable except for a temperature of 101.3.  Otherwise  she was doing fine clinically.  Her labs also showed a hemoglobin of 10.6,  otherwise everything else was normal.  She was subsequently admitted for  work-up of her high fever and chills.   HOSPITAL COURSE:  PROBLEM #1 -  FEVER AND CHILLS:  Differential for this was  varied.  Most significantly, possible endocarditis, especially with occult  organism including the  __________ organisms were considered.  Multiple blood  cultures were all performed.  Patient had 1/4 blood cultures positive for  gram positive cocci in clusters but was considered a contaminate.  She had  both 2-D echo and TEE performed to see if she had any vegetation which was  negative.  Since admission, patient has not had any fevers.  Per patient,  her fevers were really triggered by cold environment.  Hormonal causes are  likely to be the cause.  She is on hormone replacement with Synthroid and  her TSH was consistently low indicating that she may be taking high dose of  Synthroid.  I have advised her now to hold off on her Synthroid for a few  more days until she is seen in the hospital.  ID consult was asked for.  Since patient initially reacted to antibiotics and had multiple antibiotic  hypersensitivities.  Per Dr. Orvan Falconer, we were to hold all antibiotics.  We  observed her off the antibiotics.  So far, patient had no positive cultures  and no fevers off antibiotics.  She had abdominal ultrasound as an  outpatient and that was also repeated here to see the size of her spleen  which was at upper limits of normal.  With this in mind, her fever was  thought to be something that is transient, possibly due to connective tissue  disease that is yet to be determined.  She already has an appointment  scheduled with Dr. Phylliss Bob on September 05, 2004, and I have encouraged her to keep  that appointment.   PROBLEM #2 -  CHRONIC SINUS PROBLEMS:  This was also thought to probably be  contributing to her fevers as she could have occult sinusitis.  Sinus CT  that was done here was negative for that.  As such, she was only maintained  on Nasonex nasal inhaler for symptomatic control.   PROBLEM #3 -  MILD INTERMITTENT BRONCHIAL ASTHMA:  Patient had no worsening  symptoms during this hospitalization.  LABORATORY DATA:  As part of the patient's work-up, the following labs were  ordered.  Her sed rate  was 51.  Her  ANA was 105 which was really equivocal.  She had a renal ultrasound that was also normal and also her spleen site as  indicated was also normal.  Her blood cultures were repeatedly negative as  indicated except for that one contaminant.   Her TEE and 2-D echo also did not show any vegetation.   Will therefore discharge patient as a stable patient and observe her closely  in case these things recur.       LG/MEDQ  D:  08/22/2004  T:  08/22/2004  Job:  [id number]   cc:   Soyla Murphy. Renne Crigler, M.D.  [address]  [place]  [place] [address]  Fax: [phone]   Antionette Char, MD  [address]  [place]  [place] [address]  Fax: [phone]   Areatha Keas, M.D.  [address]  [address]  [employer]  [place] [address]  Fax: [phone]   Cliffton Asters, M.D.  [address] [place]  [place] [address]  Fax: [phone]

## 2010-06-30 NOTE — Discharge Summary (Signed)
[name], [name]              ACCOUNT NO.:  [id number]   MEDICAL RECORD NO.:  [id number]          PATIENT TYPE:  INP   LOCATION:  2011                         FACILITY:  [facility]   PHYSICIAN:  [name], M.D.     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   ADDITIONAL/DISCHARGE DIAGNOSES:  1.  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  2.  History of tricuspid valve endocarditis.  3.  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  4.  History of systemic vasculitis followed by Dr. Phylliss Bob and on chronic      prednisone.  5.  Hypothyroidism.   PROCEDURES PERFORMED:  1.  Mediastinotomy with extracorporeal circulation and removal of infected      permanent pacemaker system via the right atrium.  2.  Implantation of new epicardial dual-chamber permanent pacing system.   HISTORY:  The patient is a 50 year old female with a long history of  congenital complete heart block.  She is pacemaker dependent and had her  initial pacemaker placed at age 31.  She has undergone several revisions in  the past and now presents with a recurrent bacteremia from secondary to  staph epi.  She was felt to have a tricuspid valve endocarditis with  moderate tricuspid regurgitation.  She has been treated with IV antibiotics  for a prolonged period.  She has a 50 year old unipolar right ventricular  lead and a 50 year old right atrial lead which were connected to a pacemaker  generator in a subcutaneous right anterior chest wall pocket.  The right  atrial lead is not functional and has apparently been capped off; however,  the right ventricular lead was still functional.  In addition, she has two  epicardial screw-in leads placed in the right ventricle from a previous time  which subsequently had failed and were cut off and allowed to retract into  the pericardium.  She was initially evaluated by Dr. Sharrell Ku for  percutaneous extraction of the pacing system.  However, because of the  length of time that these pacemaker leads have been in place, it was felt  that an open surgical removal was likely.  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  She was subsequently  referred to Dr. Laneta Simmers and was seen in the office and backup surgical plan  was discussed with the patient and her family in the event that the  percutaneous extraction was not possible.  He discussed all the risks,  benefits and alternatives of surgery to the patient and she agreed to  proceed.   HOSPITAL COURSE:  The patient was admitted to [facility] on  08/10/2005.  Initially, Dr. [name] attempted to remove the pacemaker  leads  percutaneously.  However, this was not felt to be possible and the procedure  was aborted.  She subsequently required open removal and placement on  cardiopulmonary bypass, all of which is described in the operative report by  Dr. Laneta Simmers.  She tolerated the procedure well and new epicardial dual-  chamber pacemaker was placed.  She was taken to the ICU in stable condition  postoperatively.  She was able to be extubated shortly after surgery.  She  was hemodynamically stable and doing well on postop day #1.  At that time,  her hemodynamic monitoring devices and chest tubes were removed and she was  able to be transferred to the floor.  Postoperatively, she has done very  well overall.  She completed 3 days of postoperative IV vancomycin.  However, due to significant pruritus associated with vancomycin, this was  discontinued, and she was switched by Dr. Maurice March of  Infectious Disease to  Cubicin.  She has completed 5 postoperative days of IV antibiotics, and per  Dr. Maurice March, since her cultures intraoperatively have been negative, she will  not require further antibiotic treatment.  She is otherwise doing well.  She  has remained afebrile, and all vital signs have been stable.  Her surgical  incision sites are all healing well.  Her pacemaker has been interrogated  and is functioning appropriately.  Her most recent labs show hemoglobin of  9.7, hematocrit 28.9, platelets 67, white blood cell count 13.4, sodium 136,  potassium 4.0, BUN 11, creatinine 1.5.  At this time, postop day #5, her  PICC line has been removed and she is ready for discharge home.   DISCHARGE MEDICATIONS:  1.  Prednisone 5 mg daily.  2.  Synthroid 88 mcg daily.  3.  Ativan 0.5 mg as needed.  4.  Zofran 8 mg p.r.n..  5.  Tylox 1-2 q.4 h p.r.n. for pain.   DISCHARGE INSTRUCTIONS:  She is asked to refrain from driving, heavy lifting  or strenuous activity.  She may continue ambulating daily and using her  incentive spirometer.  She may shower daily and clean her incisions with  soap and water.  She will continue her same preoperative diet.   DISCHARGE FOLLOWUP:  She will make an appointment to see Dr. [name] and also  Dr. Aleen Campi as directed.  She will see Dr. Laneta Simmers back in the office in 3  weeks and will have chest x-ray at [facility] 1 hour prior to  this appointment.  She will call in the interim if she experiences any  problems or has questions.      [name], P.A.      [name], M.D.  Electronically Signed    GC/MEDQ  D:  08/15/2005  T:  08/15/2005  Job:  [id number]   cc:   Doylene Canning. [name], M.D.  [address]  [address]  [place]  [place] [address]   Antionette Char, MD  Fax: [phone]   Cliffton Asters, M.D.  Fax: [phone]   Areatha Keas, M.D.  Fax: [phone]

## 2010-06-30 NOTE — Op Note (Signed)
[name], KUEKER              ACCOUNT NO.:  [id number]   MEDICAL RECORD NO.:  [id number]          PATIENT TYPE:  INP   LOCATION:  2305                         FACILITY:  [facility]   PHYSICIAN:  [name], M.D.     DATE OF BIRTH:  1960/11/02   DATE OF PROCEDURE:  08/10/2005  DATE OF DISCHARGE:                                 OPERATIVE REPORT   PREOPERATIVE AND POSTOPERATIVE DIAGNOSIS:  Infected permanent pacemaker  system with recurrent staff epibacteremia.   OPERATIVE PROCEDURE:  Median sternotomy, extracorporeal circulation, removal  of infected permanent pacemaker system via the right atrium.  Implantation  of new epicardial dual-chamber permanent pacing system.   ATTENDING SURGEON:  [name], M.D.   ASSISTANT:  [name], P.A.-C   ANESTHESIA:  General endotracheal.   CLINICAL HISTORY:  The patient is a 50 year old woman with a long history of  pacemaker dependence due to congenital complete heart block.  She had a  pacemaker placed at the age of 21.  She now presents with recurrent staph  epibacteremia and has been symptomatic from this for about 50 years.  She was  felt to have tricuspid valve endocarditis with moderate tricuspid  regurgitation.  She has a 50 year old unipolar right ventricular lead and 50-  year-old right atrial lead.  These were connected to a pacemaker generator  in a subcutaneous pocket in the right anterior chest wall.  The right  ventricular lead is still functional.  The right atrial lead was not working  and apparently was capped off.  In addition, she has 2 epicardial screw-in  leads placed in the right ventricle at a previous time which subsequently  failed and were cut off and allowed to retract up into the pericardium.  She  was evaluated by Dr. [name] for percutaneous extractions pacing system  but due to the time that these pacemaker leads have been present; it was  felt that open surgical removal was likely.  She had had a vena  cavogram,  recently, which showed some stenosis at the junction of the superior vena  cava and right atrium with retrograde flow out into the azygos vein.  I saw  the patient in the office and discussed a surgical plan with her and her  husband including a possible percutaneous extraction by Dr. [name]; and if  that failed planned to proceed with median sternotomy and open surgical  removal on cardiopulmonary bypass.  I discussed the operative procedure,  alternatives, benefits, and risks; including but not limited to bleeding,  blood transfusion, infection, stroke, myocardial infarction, injury to the  heart and great vessels, organ failure, and death.  She understood all this  and agreed to proceed.  I told her that if she did require open surgical  removal, that we would plan to insert an epicardial dual-chamber pacing  system at that time.   OPERATIVE PROCEDURE:  The patient was taken to the room, and placed on table  in the supine position.  She was given preoperative intravenous antibiotics.  After induction of general endotracheal anesthesia, a Foley catheter was  placed in bladder  using sterile technique.  Then the neck, chest, abdomen,  and both lower extremities were prepped and draped in the usual sterile  manner.  Initially Dr. [name] tried to place a transvenous pacing lead via  the right internal jugular vein since she was totally pacemaker dependent.  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  Since it was not possible to  place a transvenous pacing lead through this location, Dr. [name] felt that  the best course of action would be to proceed with open surgical removal of  the infected pacing system.  Therefore the chest was opened through a median  sternotomy incision; and the pericardium was opened in the midline.   Examination of the heart showed good ventricular contractility.  The  ascending aorta had no palpable plaques in it.  The 2 old screw-in right  ventricular leads were identified.  There was no gross infection around  them.  Transesophageal echocardiogram had been performed by anesthesiology  and showed good right and left ventricular function.  There was moderate  tricuspid insufficiency.  There were no definite vegetations seen on the  leads or tricuspid valve.   Then examination of the heart showed the tip of the old right atrial lead  was visible in the right atrial appendage.  There was marked scarring and  retraction of the right atrium along its midportion probably due to the  underlying pacing lead.  There was also marked thickening and narrowing of  the superior vena cava just above its junction with the right atrium.  This  was consistent with the history of superior vena cava obstruction.   Then the patient was heparinized and when an adequate clotting time was  achieved.  The distal ascending aorta was cannulated using a 20-French  aortic cannula for arterial fluid.  Venous outflow was achieved using a  single 40-French right-angle venous cannula placed through a pursestring  suture in the low right atrium.  Since the superior vena cava appeared to be  obstructed, I felt that most of the venous drainage would be coming through  the inferior vena cava.  I suspect that most of the upper body drainage was  draining through the superior vena cava into the azygos vein, and then down  into the inferior vena cava.  An antegrade cardioplegia and vent cannula was  inserted into the aortic root.  The superior inferior vena cavae were  encircled with tapes.   Then the patient placed on cardiopulmonary bypass.  There was good venous  range.  There was no sign of venous engorgement in the upper body.  Then the aorta was cross clamped; and 500 mL of cold blood antegrade cardioplegia was   administered in the aortic root.  There was quick arrest of the heart.  Systemic hypothermia to 32 degrees centigrade and topical hypothermic iced  saline was used.  A temperature probe was placed in septum and an insulating  pad in the pericardium.   Then the superior and inferior vena cavae were occluded with tapes.  The  right atrium was opened obliquely beginning at the tip of the right atrial  appendage and extending down along the lateral right atrial wall.  There was  marked thickening of the lateral atrial wall due to the underlying pacing  lead.  The right atrial lead was  firmly adherent to the right atrial  appendage.  With care it was carefully excised.  There was some fibrinous  debris around the lead which was removed.   Prior to going on cardiopulmonary bypass the old pacemaker generator was  exposed in the subcutaneous pocket on the right anterior chest wall.  The  old generator was removed and the old right atrial and right ventricular  leads were completely mobilized down to the subclavian vein.  Temporary  atrial and ventricular pacing wires were placed to allow pacing while this  generator was removed.   Then the right atrial lead was removed in a retrograde fashion pulling from  the right atrial side without difficulty.  Then the right ventricular lead  was identified.  This was firmly adherent to the lateral right atrial wall.  It was carefully excised from the atrial wall.  In addition this lead was  firmly adherent to the septal leaflet of the tricuspid valve and was  carefully excised from this leaflet.  It was also firmly adherent to the  right ventricular endocardium with a somewhat calcified sleeve of the  fibrous tissue around it.  This was carefully excised and the lead removed  without difficulty.  The lead was then removed from the venous system in a  retrograde manner pulling from the right atrium.  Examination of the  tricuspid valve showed no  vegetations.  There was some retraction of the  septal leaflet due to the chronic pacemaker lead; and I suspect that this  was causing some regurgitation.  She had had moderate tricuspid  regurgitation for quite some time and was tolerating this well.  She denied  any vegetations on the tricuspid valve; and I did not feel that replacing  that valve or placing a prosthetic ring would be a good idea in the setting  of active infection.   Then the right atrium was closed in two layers using continuous 4-0 Prolene  suture.  This closure was lightly covered with BioGlue for hemostasis.  Then  the crossclamp was removed with a time of 37 minutes and the patient was  rewarmed to 37 degrees centigrade.   Then attention was turned to the permanent epicardial system.  The old  epicardial leads were removed from the right ventricle.  Then 2 epicardial  screw-in leads were placed on the inferior wall of the right ventricle.  The first had serial number [id number].  This had impedance of 784 with a threshold  of 2.4 volts at 0.5 milliseconds and 4.2 mA.  The second lead had serial  number [id number].  It had an impedence of 859 with a threshold of 2.6 volts.  These were felt to be adequate for epicardial leads.  Then an epicardial  right atrial screw-in pacing lead was placed.  This was placed near the  right atrial superior vena cava junction.  The P wave was 0.9-1.0 mV with a  threshold of 2.2 volts at 0.5 milliseconds and 5.4 mA.  The impedance was  715 ohms.  This was felt to be satisfactory.  These leads were then brought  inferiorly and tunneled to a subcutaneous pocket placed in the left anterior  abdominal wall.  These leads were connected to a [employer] Victory XL DR DDDR  pacing generator with the serial number [id number].  One of the right  ventricular leads was connected to the generator and one was capped off and  coiled in the pocket.  Then the patient was weaned  from cardiopulmonary  bypass on no  __________ impedance.  Total bypass time was 90 minutes.  Cardiac function appeared good.  Transesophageal echocardiogram showed  normal right and left heart function.  There was moderate tricuspid  regurgitation.  Protamine was then given and the venous and aortic cannulas  were removed without difficulty.  Hemostasis was achieved.  The patient was  given 10 units of platelets due to thrombocytopenia with platelet count of  65,000.  A single 36-French chest tube was placed behind the sternum.  The  pericardium was loosely reapproximated over the heart.  The sternum was  closed with #6 stainless steel wires.  The fascia was closed with continuous  #1 Vicryl suture.  Subcutaneous tissue was closed with continuous 2-0 Vicryl  and the skin with a 3-0 Vicryl subcuticular closure.  The new pacemaker site  was closed in layers in a similar manner; and the old pacemaker incision was  also closed in layers in a similar manner.  The sponge, needle, and  instrument counts were correct according to the scrub nurse.  Dry sterile  dressings were applied over the incision and around the chest tube which was  hooked to Pleur-Evac suction.  The patient remained hemodynamically stable,  and was transported to the ICU in guarded, but stable condition.      [name], M.D.  Electronically Signed     BB/MEDQ  D:  08/10/2005  T:  08/11/2005  Job:  [id number]   cc:   Doylene Canning. [name], M.D.  [address]  [address]  [place]  [place] [address]

## 2010-06-30 NOTE — Op Note (Signed)
[name], [name]              ACCOUNT NO.:  [id number]   MEDICAL RECORD NO.:  [id number]          PATIENT TYPE:  OUT   LOCATION:  OMED                         FACILITY:  [facility]   PHYSICIAN:  Rose Phi. Myna Hidalgo, M.D. DATE OF BIRTH:  15-Sep-1960   DATE OF PROCEDURE:  11/21/2004  DATE OF DISCHARGE:                                 OPERATIVE REPORT   NATURE OF PROCEDURE:  Left posterior iliac crest bone marrow biopsy and  aspirate.   [name] was brought to the Short Stay Unit. She had an IV placed into her  right arm. She was then placed onto her right side.   She received a total of 7 mg of Versed and 37.5 mg of Demerol for IV  sedation.   The left posterior iliac crest region was prepped and draped in a sterile  fashion. 10 mL of 2% lidocaine was infiltrated under the skin and down to  the periosteum. I actually had to use a 22 gauge spinal needle to reach the  periosteum.   A #11 scalpel was used to make an incision into the skin. Despite several  attempts, a scant bone marrow aspirate was obtained. Unfortunately no cells  were noted.   We then made a second incision into the skin. A good bone marrow biopsy core  was obtained without difficulty. The patient tolerated the procedure well.  There are no complications.      Rose Phi. Myna Hidalgo, M.D.  Electronically Signed     PRE/MEDQ  D:  11/21/2004  T:  11/21/2004  Job:  [id number]

## 2010-06-30 NOTE — Discharge Summary (Signed)
[name], [name]              ACCOUNT NO.:  [id number]   MEDICAL RECORD NO.:  [id number]          PATIENT TYPE:  INP   LOCATION:  4735                         FACILITY:  [facility]   PHYSICIAN:  C. Ulyess Mort, M.D.DATE OF BIRTH:  Aug 31, 1960   DATE OF ADMISSION:  07/21/2005  DATE OF DISCHARGE:  07/24/2005                                 DISCHARGE SUMMARY   DISCHARGE DIAGNOSES:  1.  Fever.  2.  Nausea/vomiting.  3.  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  4.  History of systemic vasculitis followed by Dr. Phylliss Bob of rheumatology.  5.  Hypothyroidism.  6.  History of congenital heart block secondary to pacemaker placement with      multiple revisions.  7.  History of bronchial asthma.   MEDICATIONS AT DISCHARGE:  1.  Vancomycin IV.  2.  Rifampin 300 mg p.o. daily.  3.  Prednisone 5 mg p.o. daily.  4.  Synthroid 88 mcg p.o. daily.  5.  Benadryl 25 mg prior to vancomycin dosing.  6.  Ativan 1 mg p.r.n. anxiety up to q.8h.   CONDITION AT DISCHARGE:  Improved with resolution of high fevers, nausea and  vomiting.  The patient will follow up with Dr. Orvan Falconer in infectious  disease clinic as well as Dr. [name] and Dr. Laneta Simmers for planned removal of  pacemaker in later in the month.   PROCEDURES:  1.  Chest x-ray July 22, 2005:  No acute cardiopulmonary disease.  2.  July 23, 2005:  Fluoroscopic-guided exchange of left upper extremity      PICC for a new double-lumen PICC.   BRIEF HISTORY AND PHYSICAL:  Ms. Releford is a 50 year old woman with past  medical history significant for congenital heart block, pacemaker placement  at age 49, history of multiple revisions for failed atrial leads, who was  recently admitted in May 2007 for continued workup of a fever of unknown  origin which has lasted greater than a year.  An extensive workup was done  including blood cultures, transesophageal echocardiograms, CT scan, PET  scan, bone  marrow biopsy, cystoscopy, and thorough blood work.  Ultimately,  blood cultures done at [facility] grew out 4/4 coagulase-negative staph  and a transesophageal echocardiogram in May 2007 showed tricuspid valve  vegetation.  At that time the patient was started on a 6-week course of IV  vancomycin with plans to remove the pacer in the near future.  The patient  continued to have fevers and nausea and rifampin was added by Dr. Orvan Falconer  at a clinic visit 4 days prior to admission.  The patient states her nausea  and vomiting have become more severe and are associated with fevers that  have become higher in the past 2 weeks.  On the day of admission she reports  a fever to 103 with significant nausea and vomiting.  Of note, the patient  has also been diagnosed with a systemic vasculitis and followed by Dr. Phylliss Bob  of rheumatology for this.  She has been in the process of tapering  prednisone dose  in anticipation of surgery.   EXAMINATION:  VITAL SIGNS:  Temperature is 102, blood pressure 119/72, pulse  79, respirations 24, oxygen saturation 100% on room air.  GENERAL:  The patient was somewhat irritable and very talkative, in no acute  distress.  HEENT:  Eyes:  Pupils equal, round, and reactive to light.  ENT:  Oropharynx  was clear.  Mucous membranes were slightly dry.  NECK:  Supple without lymphadenopathy, no thyromegaly.  LUNGS:  Clear to auscultation.  HEART:  Regular rate and rhythm with a 2/6 systolic ejection murmur.  Pacemaker sounds are present.  She has surgical scars on her right upper  chest and just above the epigastric region.  ABDOMEN:  Soft, nontender, nondistended, bowel sounds are present.  EXTREMITIES:  No edema, good peripheral pulses bilaterally.  SKIN:  The patient has some erythema of her cheeks and nasal bridge which  she associates with being febrile.  No lymphadenopathy is present.  NEUROLOGIC:  Nonfocal.   LABORATORY DATA ON ADMISSION:  Sodium 137,  potassium 3.7, chloride 102,  bicarb 27, BUN 11, creatinine 1.5, glucose 91, bilirubin 1.3, alk phos 73,  AST 16, ALT 12, protein 6.1, albumin 3.2, calcium 8.3.  White count 7.4,  hemoglobin 9.7, platelets 149, ANC 5.5, MCV 77.4.   HOSPITAL COURSE BY PROBLEM:  #1 - FEVERS.  The patient had a long and  complex history of documented fevers.  She has recently been discovered to  have coagulase-negative Staphylococcus aureus tricuspid valve/pacemaker  endocarditis.  Due to scheduling difficulties with the various physicians  involved, this has been postponed until later in the month.  Given the  patient's increased fevers, nausea, vomiting, she is admitted for  observation.  Blood cultures were obtained and at the time of this dictation  are negative.  Chest x-ray showed no evidence of pulmonary infection.  Urine  culture was negative as well.  There is no reason to suspect any other  etiology for fever other than the known endocarditis with existing hardware  in place.  Dr. Orvan Falconer saw the patient in-hospital and assisted with her  care and management.  The patient did not have any more nausea and vomiting  while in hospital despite the treatment with vancomycin and rifampin; thus,  it was not thought that she was intolerant of rifampin and she will be  discharged on a regimen of vancomycin and rifampin in anticipation of  surgery later in the month.  CVTS was asked to evaluate the patient to see  if anything needed to be done earlier and his thought was in the absence of  positive blood cultures or persistent high-grade fever, there was no need  for immediate intervention at this time.   #2 - ACUTE RENAL FAILURE.  On admission the patient's creatinine was 1.5,  which had been 1.0 a month ago at her last admission.  This was thought to  be secondary to dehydration in the setting of high fevers and nausea and vomiting.  IV fluids were started initially.  Repeat creatinine the  following  morning showed improvement at 1.3.  However, from this point on  the patient refused labs or IV fluids.  The thought at the time of discharge  was that she was slightly dehydrated.  If the patient is willing in the  future a followup chemistry can be obtained.  Her rise in creatinine may  also be attributable to vancomycin therapy.   #3 - MICROSCOPIC HEMATURIA.  A urinalysis obtained on this  admission was  consistent with previous results showing microscopic hematuria.  The patient  has had a cystoscopy in the past which was unrevealing.  It is thought that  hematuria is due to septic emboli in the setting of endocarditis.   #4 - QUESTION OF SYSTEMIC VASCULITIS.  The patient is followed by Dr. Phylliss Bob,  rheumatology, for a nonspecific vasculitis.  She is currently on a  prednisone taper.  Contribution of the decrease in prednisone dose to her  fevers is unclear.  She will follow up with Dr. Phylliss Bob as an outpatient for  continued management of these issues.   DISCHARGE VITAL SIGNS:  July 24, 2005:  Temperature 98.7, pulse 72,  respirations 18, blood pressure 121/71, oxygen saturation 98% on room air.      Clent Demark, M.D.    ______________________________  C. Ulyess Mort, M.D.    Verlin Grills  D:  07/25/2005  T:  07/25/2005  Job:  [id number]   cc:   Cliffton Asters, M.D.  Fax: [phone]   Doylene Canning. [name], M.D.  [address]  [address]  [place]  [place] [address]   [name], M.D.  [address]  [place]  [place] [address]

## 2010-06-30 NOTE — H&P (Signed)
NAMEBLAIZE, NIPPER              ACCOUNT NO.:  [id number]   MEDICAL RECORD NO.:  [id number]          PATIENT TYPE:  INP   LOCATION:  1824                         FACILITY:  [facility]   PHYSICIAN:  Ulyses Amor, MD DATE OF BIRTH:  1960/12/18   DATE OF ADMISSION:  09/27/2005  DATE OF DISCHARGE:                                HISTORY & PHYSICAL   [name] is a 50 year old white woman who is admitted to the [facility] for observation after pacemaker reprogramming in the emergency  department.  The patient has a history of congenital heart block.  She has  received multiple pacemakers and undergone multiple revisions in the past.  In June of this year, she underwent removal of a pacemaker infected with  staphylococcus epidermitis and reimplantation of a new system.  Today in the  office, her pacemaker was apparently programmed.  Since that time, she has  experienced four to six episodes of near syncope or even several seconds of  syncope.  These have lasted only seconds.  There has been no chest pain  other than mild tenderness at the pacemaker implantation site.  She  presented to the emergency department where she was found to be bradycardic  (in the 30s) with complete heart block.  The pacemaker tech was consulted  and found her ventricular threshold to be 1.5 and her atrial threshold to be  1.0 V.  The ventricular output was set to 3.5 V and the atrial output was  set to 2.5 V with resultant complete capture.  Though it is likely that  bradycardia was the cause of these near syncopal episodes, she is brought in  to the hospital for observation to make sure that her pacemaker continues to  function properly.  The patient has no other symptoms or complaints at this  time.   The patient has a past history of tricuspid valve endocarditis.  There is  also a history of systemic vasculitis.  In addition, she has asthma.   MEDICATIONS:  Prednisone, Synthroid, Zyvox.   ALLERGIES:  1. DOXYCYCLINE.  2. ERYTHROMYCIN.  3. PENICILLIN.  4. SEPTRA.  5. ZITHROMAX.   SOCIAL HISTORY:  The patient lives with her husband.  She neither smokes  cigarettes, drink alcohol or uses drugs.   FAMILY HISTORY:  Noncontributory.   REVIEW OF SYSTEMS:  Reveals no new problems related to her head, eyes, ears,  nose, mouth, throat, lungs, gastrointestinal system, genitourinary system or  extremities.  There is no history of neurologic or psychiatric disorder.  There is no history of fever, chills or weight loss.   PHYSICAL EXAMINATION:  Pulse 89 and regular, blood pressure 116/64.  The  patient was a middle-aged white woman in no discomfort.  She was alert,  oriented, appropriate and responsive.  Head, eyes, nose and mouth were  normal.  The neck was without thyromegaly or adenopathy.  Carotid pulses  were palpable bilaterally and without bruits.  Cardiac examination revealed  a normal S1 and S2.  There was no S3, S4, murmur, rub or click.  Cardiac  rhythm was regular.  No  chest wall tenderness was noted.  The lungs were  clear.  The abdomen was soft and nontender.  There was no mass,  hepatosplenomegaly, bruit, distension, rebound, guarding or rigidity.  Bowel  sounds were normal.  Breasts, pelvic and rectal examinations were not  performed as they were not pertinent to the reason for acute care  hospitalization.  Extremities were without edema, deviation or deformity.  Radial and dorsalis pedis pulses were palpable bilaterally.  Brief screening  neurologic survey was unremarkable.   Electrocardiogram revealed complete heart block with a ventricular rhythm.  A chest radiograph report was pending at the time of this dictation.  Myoglobin was 67.0, CK-MB less than 1.0 and troponin less than 0.05.  Potassium was 3.8, BUN 11 and creatinine 1.1.  White count was 9.1 with a  hemoglobin of 11.7 and hematocrit 35.5.  The remaining studies were pending  at the time of this  dictation.   IMPRESSION:  1. Symptomatic bradycardia with near syncope or syncope.  This is due to      pacemaker failure to capture and has been rectified by raising her      ventricular atrial lead output.  2. Permanent pacemaker with history of multiple revisions for congenital      heart block.  3. Recent infection with permanent pacemaker.  4. History of tricuspid valve endocarditis.  5. History of systemic vasculitis.  6. Asthma.   PLAN:  1. Telemetry.  2. Pacemaker tech has been consulted and has made the adjustments as      described above.  3. Further measures per Dr. Aleen Campi.      Ulyses Amor, MD  Electronically Signed     MSC/MEDQ  D:  09/27/2005  T:  09/28/2005  Job:  [id number]   cc:   Antionette Char, MD

## 2010-06-30 NOTE — Cardiovascular Report (Signed)
[place]  Patient:    [name], [name] Visit Number: [id number] MRN: [id number]          Service Type: CAT Location: [id number] Attending Physician:  Silvestre Mesi Dictated by:   Aram Candela. Aleen Campi, M.D. Proc. Date: 08/22/01 Admit Date:  08/22/2001 Discharge Date: 08/23/2001                          Cardiac Catheterization  PROCEDURE:  Replacement of pacemaker pulse generator and attempted right subclavian vein stick.  INDICATIONS:  This 50 year old female has a history of congenital complete heart block and received her first pacemaker at age 46 at the [facility] in [place], [place].  This was a VVI pacemaker with a large pulse generator and this pulse generator later migrated through her right breast and extruded underneath her right breast on the lower aspect of the pulse generator.  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 Orson Slick, general surgeon, and replaced it with a smaller unit, higher in her right chest.  She did well with that unit still being a VVI pacer from 1983 until 1990 when that pulse generator had battery failure.  Her third pacemaker then was implanted in 1990 at [facility] at which time we upgraded the system to a dual chamber pacemaker by inserting a new atrial lead into the right subclavian vein.  She states that we encountered marked difficulty in inserting the new atrial lead in 1990.  This was a unipolar lead and she continued to have a unipolar system.  She now returns 13 years later with battery depletion showing signs of expected replacement time of the pulse generator and we elected to schedule her procedure for pulse generator replacement today.  We discussed this with the pacemaker rep and arranged for a unipolar unit with large connectors to accommodate the existing leads that are in  place.  DESCRIPTION OF PROCEDURE:  After signing an informed consent, the patient was premedicated with 50 mg of Benadryl intravenously and brought to the cardiac catheterization lab.  Her right anterior chest and base of neck were prepped and draped in the usual sterile fashion and a right transverse subclavicular plane was anesthetized locally overlying an existing scar and overlying the pulse generator.  An incision was made in this anesthetized plane with the incision being deepened into the fibrous layer overlying the pulse generator and at her request, the scar was excised.  We then withdrew the pulse generator from the pacing electrodes and electrodes were analysed.  We found acceptable chronic thresholds on the ventricular lead with a minimum voltage threshold of 2.3 volts utilizing 5 milliamps of current.  The resistance across the lead was 600 ohms.  We were unable to obtain an R wave sensitivity because no R waves were present.  We then analysed the atrial lead and found that it had no sensing capability and documented the nonsensing that had been present for some time on her telephone monitoring.  We then discussed alternatives with the patient and elected to insert a new atrial lead in the right subclavian vein.  We then made multiple sticks into the right subclavian vein and multiple attempts using a Smart needle for ultrasound guidance and these were all unsuccessful.  We finally were able to use ultrasound guidance to enter a  medial vein which ended up being the right jugular and we were unable to advance the wire downward.  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  She had good collaterals around this section.  We then discussed alternatives again with the patient and informed her that we were going to be unable to put in a new atrial lead in the right side and made a recommendation to put in a new  bipolar system in the left chest using the left subclavian vein.  However, she refused to consider this option.  We felt that this was her best longterm alternative.  After she refused this alternative, we offered to cap off the atrial channel and the new pulse generator which was on the table and continue pacing her in a VVI mode.  She felt that this was the least invasive of all alternatives and wanted [place] to proceed with this alternative. We then proceeded to cap off the atrial channel on the new pulse generator and continued to pace in a VVI mode.  The wound was then lavaged profusely with kanamycin solution.  The pulse generator was in the pocket the entire time because of her being dependent on the pacemaker and also because it is a unipolar lead system.  We then repositioned the pulse generator by freeing up the leads which were adhered to the scar tissue and then positioned the pulse generator within the existing pocket.  The wound was then lavaged profusely with kanamycin solution.  We then closed the wound in layers using 2-0 Dexon. Final skin closure was obtained with a cutaneous layer of Steri-Strips.  The patient tolerated the procedure well without complications and the pacemaker noted to be functioning normally in the VVI mode and she was then admitted to a telemetry bed overnight for monitoring and to continue the Ancef for three more doses.  She will then able to be discharged tomorrow. Dictated by:   Aram Candela. Aleen Campi, M.D. Attending Physician:  Silvestre Mesi DD:  08/22/01 TD:  08/25/01 Job: [id number]

## 2010-06-30 NOTE — Op Note (Signed)
[name], VANDEZANDE              ACCOUNT NO.:  [id number]   MEDICAL RECORD NO.:  [id number]          PATIENT TYPE:  INP   LOCATION:  2305                         FACILITY:  [facility]   PHYSICIAN:  Kaylyn Layer. Michelle Piper, M.D.   DATE OF BIRTH:  1960/06/11   DATE OF PROCEDURE:  08/10/2005  DATE OF DISCHARGE:                                 OPERATIVE REPORT   PROCEDURE:  Transesophageal echocardiographic probe placement and monitoring  during removal of pacemaker lead on cardiopulmonary bypass.   BODY OF REPORT:  I was consulted by Dr. [name] and Dr. [name] to  place a transesophageal echo probe into [name], who is an unfortunate  lady who has a known infected pacemaker lead that needs to be removed and  needed to be placed on cardiopulmonary bypass to have this done.  She had a  pacemaker placed 30 years before for unidentified arrhythmia and was pacer  dependent.   After uneventful induction of anesthesia via endotracheal intubation, the  transesophageal echo probe was easily placed into the stomach.  Initial  short axis view of the left ventricle showed normal left ventricle in all  quadrants without any wall motion abnormalities.  The right ventricle looked  normal too.   Turning to the mitral valve, the structures looked normal in all views.  The  leaflets coapted normally.  On placing color flow Doppler across the valve,  there was no valvular abnormality.  Left ventricular outflow tract was  normal.  Aortic valve was trileaflet.  Looking at it in the __________  placing color flow Doppler across the valve, there was no aortic valve  disorder.  The left atrium was normal.  Intra-atrial septum was normal.  Turning to the tricuspid valve, I could not see the septal cusp, and on  placing color flow Doppler across the valve, there was 2-3+ tricuspid  regurgitation.  There was no evidence of the pacemaker lead could be seen by  echo, and there was no catheter in her right  atrium.  The right atrium was  somewhat enlarged.  There was no evidence of vegetation on the tricuspid  valve.   The patient was placed on cardiopulmonary bypass by Dr. Laneta Simmers and underwent  a pacemaker lead removal and repair of tricuspid valve.  Immediately prior  to discontinuing cardiopulmonary bypass, I did another evaluation, and it  showed the left ventricle was unchanged, as was the right ventricle.  There  were no wall motion abnormalities, specifically the mitral valve and the  aortic valves were unchanged.  The tricuspid valve was unchanged  structurally.  On placing color flow Doppler across the valve, there was  still 2-3+ tricuspid regurgitation.  Intra-atrial septum was normal.  Both  atria were normal.  I could not see any evidence of endocarditis or any  vegetations on the valve.   At the end of the procedure, the transesophageal echo probe was removed.  The patient was taken to the intensive care unit in stable condition.           ______________________________  Kaylyn Layer Michelle Piper, M.D.  KDO/MEDQ  D:  08/10/2005  T:  08/10/2005  Job:  [id number]

## 2010-07-05 ENCOUNTER — Encounter: Payer: Self-pay | Admitting: Internal Medicine

## 2010-07-11 ENCOUNTER — Ambulatory Visit (INDEPENDENT_AMBULATORY_CARE_PROVIDER_SITE_OTHER): Payer: BC Managed Care – PPO | Admitting: Internal Medicine

## 2010-07-11 ENCOUNTER — Encounter: Payer: Self-pay | Admitting: Internal Medicine

## 2010-07-11 VITALS — BP 131/86 | HR 63 | Ht 63.0 in | Wt 178.0 lb

## 2010-07-11 DIAGNOSIS — Z950 Presence of cardiac pacemaker: Secondary | ICD-10-CM

## 2010-07-11 DIAGNOSIS — I442 Atrioventricular block, complete: Secondary | ICD-10-CM

## 2010-07-11 DIAGNOSIS — I4891 Unspecified atrial fibrillation: Secondary | ICD-10-CM | POA: Insufficient documentation

## 2010-07-11 NOTE — Assessment & Plan Note (Signed)
The patient has occasional palpitations. Interrogation of her pacemaker today demonstrates that she is in atrial fibrillation approximately 1% of the time. I will make any medication changes at this time. I have encouraged her to lose weight to help reduce the chance of developing diabetes.

## 2010-07-11 NOTE — Patient Instructions (Signed)
Your physician wants you to follow-up in: 6 months in the device clinic and 12 months with Dr Taylor You will receive a reminder letter in the mail two months in advance. If you don't receive a letter, please call our office to schedule the follow-up appointment.  

## 2010-07-11 NOTE — Assessment & Plan Note (Signed)
Her pacemaker is working normally. Her battery longevity is 1-2 years. I will see her back in a year.

## 2010-07-11 NOTE — Progress Notes (Signed)
HPI [name] returns today for followup. She has a history of congenital complete heart block, status post pacemaker insertion. The patient developed pacemaker pocket infection. She had to undergo removal of a surgical approach with an epicardial system replaced. She has been stable since we last saw her. She has had trouble with weight gain and is working on trying to lose weight. She has occasional palpitations. She denies chest pain. She has dyspnea with exertion. Sometimes he has to stop and rest when she is walking up hill. She has had no syncope. She does have a history of dietary indiscretion. Allergies  Allergen Reactions  . Azithromycin   . Doxycycline   . Erythromycin   . Penicillins   . Sulfamethoxazole W/Trimethoprim      Current Outpatient Prescriptions  Medication Sig Dispense Refill  . cephALEXin (KEFLEX) 500 MG capsule As needed dental work       . levothyroxine (SYNTHROID, LEVOTHROID) 88 MCG tablet Take 88 mcg by mouth daily.        . metoprolol (LOPRESSOR) 100 MG tablet Take 50 mg by mouth daily.           Past Medical History  Diagnosis Date  . Presence of permanent cardiac pacemaker   . PVC (premature ventricular contraction)   . Hypothyroidism     unspecified  . COPD (chronic obstructive pulmonary disease)   . CAD (coronary artery disease)     s/p CABG    ROS:   All systems reviewed and negative except as noted in the HPI.   No past surgical history on file.   No family history on file.   History   Social History  . Marital Status: Married    Spouse Name: N/A    Number of Children: N/A  . Years of Education: N/A   Occupational History  . Not on file.   Social History Main Topics  . Smoking status: Current Everyday Smoker    Types: Cigarettes  . Smokeless tobacco: Not on file  . Alcohol Use: Not on file  . Drug Use: Not on file  . Sexually Active: Not on file   Other Topics Concern  . Not on file   Social History Narrative  . No  narrative on file     BP 131/86  Pulse 63  Ht 5\' 3"  (1.6 m)  Wt 178 lb (80.74 kg)  BMI 31.53 kg/m2  Physical Exam:  Well appearing NAD HEENT: Unremarkable Neck:  No JVD, no thyromegally Lymphatics:  No adenopathy Back:  No CVA tenderness Lungs:  Clear. Well healed pacemaker incision HEART:  Regular rate rhythm, no murmurs, no rubs, no clicks Abd:  positive bowel sounds, no organomegally, no rebound, no guarding Ext:  2 plus pulses, no edema, no cyanosis, no clubbing Skin:  No rashes no nodules Neuro:  CN II through XII intact, motor grossly intact  DEVICE  Normal device function.  See PaceArt for details.   Assess/Plan:

## 2010-12-04 ENCOUNTER — Other Ambulatory Visit (HOSPITAL_COMMUNITY): Payer: Self-pay | Admitting: Obstetrics and Gynecology

## 2010-12-04 DIAGNOSIS — Z1231 Encounter for screening mammogram for malignant neoplasm of breast: Secondary | ICD-10-CM

## 2010-12-25 ENCOUNTER — Ambulatory Visit (HOSPITAL_COMMUNITY)
Admission: RE | Admit: 2010-12-25 | Discharge: 2010-12-25 | Disposition: A | Discharge status: Home / Self Care | Payer: BC Managed Care – PPO | Source: Ambulatory Visit | Attending: Obstetrics and Gynecology | Admitting: Obstetrics and Gynecology

## 2010-12-25 DIAGNOSIS — Z1231 Encounter for screening mammogram for malignant neoplasm of breast: Secondary | ICD-10-CM | POA: Insufficient documentation

## 2011-11-21 ENCOUNTER — Ambulatory Visit (INDEPENDENT_AMBULATORY_CARE_PROVIDER_SITE_OTHER): Payer: BC Managed Care – PPO | Admitting: *Deleted

## 2011-11-21 DIAGNOSIS — I442 Atrioventricular block, complete: Secondary | ICD-10-CM

## 2011-11-21 LAB — PACEMAKER DEVICE OBSERVATION
AL AMPLITUDE: 0.2 mv
AL IMPEDENCE PM: 324 Ohm
AL THRESHOLD: 0.75 V
BATTERY VOLTAGE: 2.67 V
RV LEAD IMPEDENCE PM: 361 Ohm

## 2011-11-21 NOTE — Progress Notes (Signed)
PPM check 

## 2011-12-10 ENCOUNTER — Encounter: Payer: Self-pay | Admitting: Internal Medicine

## 2012-02-20 ENCOUNTER — Ambulatory Visit (INDEPENDENT_AMBULATORY_CARE_PROVIDER_SITE_OTHER): Payer: BC Managed Care – PPO | Admitting: *Deleted

## 2012-02-20 DIAGNOSIS — I442 Atrioventricular block, complete: Secondary | ICD-10-CM

## 2012-02-20 LAB — PACEMAKER DEVICE OBSERVATION
AL IMPEDENCE PM: 314 Ohm
ATRIAL PACING PM: 95
DEVICE MODEL PM: 1743236

## 2012-02-20 NOTE — Progress Notes (Signed)
PPM battery check only. 

## 2012-03-12 ENCOUNTER — Encounter: Payer: Self-pay | Admitting: Internal Medicine

## 2012-04-04 ENCOUNTER — Encounter: Payer: Self-pay | Admitting: *Deleted

## 2012-04-04 ENCOUNTER — Ambulatory Visit (INDEPENDENT_AMBULATORY_CARE_PROVIDER_SITE_OTHER): Payer: BC Managed Care – PPO | Admitting: Cardiology

## 2012-04-04 ENCOUNTER — Encounter: Payer: Self-pay | Admitting: Cardiology

## 2012-04-04 VITALS — BP 136/78 | HR 70 | Ht 63.0 in | Wt 183.0 lb

## 2012-04-04 DIAGNOSIS — Z950 Presence of cardiac pacemaker: Secondary | ICD-10-CM

## 2012-04-04 DIAGNOSIS — J45909 Unspecified asthma, uncomplicated: Secondary | ICD-10-CM

## 2012-04-04 DIAGNOSIS — Z45018 Encounter for adjustment and management of other part of cardiac pacemaker: Secondary | ICD-10-CM

## 2012-04-04 DIAGNOSIS — Q246 Congenital heart block: Secondary | ICD-10-CM

## 2012-04-04 DIAGNOSIS — Z45010 Encounter for checking and testing of cardiac pacemaker pulse generator [battery]: Secondary | ICD-10-CM

## 2012-04-04 LAB — PACEMAKER DEVICE OBSERVATION
BAMS-0001: 180 {beats}/min
DEVICE MODEL PM: 1743236

## 2012-04-04 NOTE — Patient Instructions (Signed)
Please follow the instruction sheet given 

## 2012-04-04 NOTE — Progress Notes (Signed)
ELECTROPHYSIOLOGY OFFICE NOTE  Patient ID: LAVONDA THAL MRN: [id number], DOB/AGE: 52/08/1960   Date of Visit: 04/04/2012  Primary Physician: Thayer Headings, MD Primary Cardiologist: [name], MD Reason for Visit: EP/device follow-up  History of Present Illness  [name] is a 52 year old woman with congenital complete heart block s/p PPM initially implanted in early 1978 (subsequent atrial lead fracture/replacement 08/1976, gen change [id number] by Drs. Tysinger/Bowman, status post removal of leads, 08/1981, gen change 1990. 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 Laneta Simmers at [facility]), HTN and hypothyroidism who presents today for routine electrophysiology followup. Since last being seen in our clinic, she reports she is doing well. She has no complaints. She specifically denies chest pain or shortness of breath. She denies palpitations, dizziness, near syncope or syncope. She denies LE swelling, orthopnea, PND or recent weight gain.   Past Medical History Past Medical History  Diagnosis Date  . Presence of permanent cardiac pacemaker   . PVC (premature ventricular contraction)   . Asthma   . Hypertension   . Congenital complete AV block 1978  . Hypothyroidism     Past Surgical History Past Surgical History  Procedure Laterality Date  . Insert / replace / remove pacemaker      s/p PPM implant 1978 for congenital CHB      Allergies/Intolerances Allergies  Allergen Reactions  . Azithromycin   . Doxycycline   . Erythromycin   . Penicillins   . Sulfamethoxazole W-Trimethoprim     Current Home Medications Current Outpatient Prescriptions  Medication Sig Dispense Refill  . cephALEXin (KEFLEX) 500 MG capsule As needed dental work       . levothyroxine (SYNTHROID, LEVOTHROID) 88 MCG tablet Take 75 mcg by mouth daily.        . metoprolol (LOPRESSOR) 100 MG tablet Take 50 mg by mouth daily.          Social History Social History  . Marital Status: Married   Social History Main Topics  . Smoking status: Never Smoker   . Smokeless tobacco: Never Used  . Alcohol Use: No  . Drug Use: No   Review of Systems General: No chills, fever, night sweats or weight changes Cardiovascular: No chest pain, dyspnea on exertion, edema, orthopnea, palpitations, paroxysmal nocturnal dyspnea Dermatological: No rash, lesions or masses Respiratory: No cough, dyspnea Urologic: No hematuria, dysuria Abdominal: No nausea, vomiting, diarrhea, bright red blood per rectum, melena, or hematemesis Neurologic: No visual changes, weakness, changes in mental status All other systems reviewed and are otherwise negative except as noted above.  Physical Exam Blood pressure 136/78, pulse 70, height 5\' 3"  (1.6 m), weight 183 lb (83.008 kg).  General: Well developed, well appearing 52 year old female in no acute distress. HEENT: Normocephalic, atraumatic. EOMs intact. Sclera nonicteric. Oropharynx clear.  Neck: Supple without bruits. No JVD. Lungs: Respirations regular and unlabored, CTA bilaterally. No wheezes, rales or rhonchi. Heart: RRR. S1, S2 present. No murmurs, rub, S3 or S4. Abdomen: Soft, non-distended.  Extremities: No clubbing, cyanosis or edema. DP/PT/Radials 2+ and equal bilaterally. Psych: Normal affect. Neuro: Alert and oriented X 3. Moves all extremities spontaneously.   Diagnostics Device interrogation PPM battery check performed today shows battery at ERI since 03/28/2012  Assessment and Plan 1. PPM battery at [facility] - Ms. Pemberton has an abdominal implant with epicardial leads and is pacemaker  dependent Discussed indications for PPM generator change. Discussed procedure in detail including risks and benefits. Risks and benefits include, but are not limited to, bleeding, infection and lead dislodgement. Ms. Ebrahimi  expressed verbal understanding and agrees to proceed.   Discussed/formulated this plan of care with Dr. [name] via phone. Signed, Rick Duff, PA-C 04/04/2012, 5:03 PM

## 2012-04-08 ENCOUNTER — Other Ambulatory Visit: Payer: Self-pay | Admitting: *Deleted

## 2012-04-08 DIAGNOSIS — Q246 Congenital heart block: Secondary | ICD-10-CM

## 2012-04-08 DIAGNOSIS — Z45018 Encounter for adjustment and management of other part of cardiac pacemaker: Secondary | ICD-10-CM

## 2012-04-11 ENCOUNTER — Encounter (HOSPITAL_COMMUNITY): Payer: Self-pay | Admitting: Pharmacy Technician

## 2012-04-16 ENCOUNTER — Other Ambulatory Visit (INDEPENDENT_AMBULATORY_CARE_PROVIDER_SITE_OTHER): Payer: BC Managed Care – PPO

## 2012-04-16 DIAGNOSIS — Z45018 Encounter for adjustment and management of other part of cardiac pacemaker: Secondary | ICD-10-CM

## 2012-04-16 DIAGNOSIS — Q246 Congenital heart block: Secondary | ICD-10-CM

## 2012-04-16 DIAGNOSIS — Z45010 Encounter for checking and testing of cardiac pacemaker pulse generator [battery]: Secondary | ICD-10-CM

## 2012-04-16 DIAGNOSIS — Z950 Presence of cardiac pacemaker: Secondary | ICD-10-CM

## 2012-04-16 DIAGNOSIS — J45909 Unspecified asthma, uncomplicated: Secondary | ICD-10-CM

## 2012-04-16 LAB — CBC WITH DIFFERENTIAL/PLATELET
Basophils Absolute: 0.1 10*3/uL (ref 0.0–0.1)
Eosinophils Absolute: 0.1 10*3/uL (ref 0.0–0.7)
HCT: 38.9 % (ref 36.0–46.0)
Hemoglobin: 13 g/dL (ref 12.0–15.0)
Lymphs Abs: 1.8 10*3/uL (ref 0.7–4.0)
MCHC: 33.3 g/dL (ref 30.0–36.0)
MCV: 85.7 fl (ref 78.0–100.0)
Monocytes Absolute: 0.6 10*3/uL (ref 0.1–1.0)
Neutro Abs: 4.7 10*3/uL (ref 1.4–7.7)
Platelets: 195 10*3/uL (ref 150.0–400.0)
RDW: 13.2 % (ref 11.5–14.6)

## 2012-04-16 LAB — BASIC METABOLIC PANEL
CO2: 29 mEq/L (ref 19–32)
Calcium: 8.5 mg/dL (ref 8.4–10.5)
Creatinine, Ser: 0.9 mg/dL (ref 0.4–1.2)
GFR: 70.07 mL/min (ref >60.00)
Sodium: 137 mEq/L (ref 135–145)

## 2012-04-16 LAB — HCG, SERUM, QUALITATIVE: Preg, Serum: NEGATIVE

## 2012-04-23 ENCOUNTER — Encounter: Payer: Self-pay | Admitting: Internal Medicine

## 2012-04-24 ENCOUNTER — Encounter: Payer: Self-pay | Admitting: Cardiology

## 2012-04-24 ENCOUNTER — Other Ambulatory Visit: Payer: Self-pay | Admitting: Cardiology

## 2012-04-24 MED ORDER — SODIUM CHLORIDE 0.9 % IJ SOLN: 3.0000 mL | Freq: Two times a day (BID) | INTRAMUSCULAR | Status: DC

## 2012-04-24 MED ORDER — SODIUM CHLORIDE 0.45 % IV SOLN
INTRAVENOUS | Status: DC
  Administered 2012-04-25: 06:00:00 via INTRAVENOUS

## 2012-04-24 MED ORDER — SODIUM CHLORIDE 0.9 % IR SOLN
80.0000 mg | Status: DC
  Filled 2012-04-24 (×2): qty 2

## 2012-04-24 MED ORDER — SODIUM CHLORIDE 0.9 % IJ SOLN: 3.0000 mL | INTRAMUSCULAR | Status: DC | PRN

## 2012-04-24 MED ORDER — CHLORHEXIDINE GLUCONATE 4 % EX LIQD
60.0000 mL | Freq: Once | CUTANEOUS | Status: DC
  Filled 2012-04-24: qty 60

## 2012-04-24 MED ORDER — SODIUM CHLORIDE 0.9 % IV SOLN: 250.0000 mL | INTRAVENOUS | Status: DC

## 2012-04-24 MED ORDER — VANCOMYCIN HCL IN DEXTROSE 1-5 GM/200ML-% IV SOLN
1000.0000 mg | INTRAVENOUS | Status: DC
  Filled 2012-04-24 (×2): qty 200

## 2012-04-25 ENCOUNTER — Encounter (HOSPITAL_COMMUNITY)
Admission: RE | Disposition: A | Discharge status: Home / Self Care | Payer: Self-pay | Source: Ambulatory Visit | Attending: Internal Medicine

## 2012-04-25 ENCOUNTER — Ambulatory Visit (HOSPITAL_COMMUNITY): Payer: BC Managed Care – PPO

## 2012-04-25 ENCOUNTER — Ambulatory Visit (HOSPITAL_COMMUNITY)
Admission: RE | Admit: 2012-04-25 | Discharge: 2012-04-25 | Disposition: A | Discharge status: Home / Self Care | Payer: BC Managed Care – PPO | Source: Ambulatory Visit | Attending: Internal Medicine | Admitting: Internal Medicine

## 2012-04-25 DIAGNOSIS — I10 Essential (primary) hypertension: Secondary | ICD-10-CM | POA: Insufficient documentation

## 2012-04-25 DIAGNOSIS — Z45018 Encounter for adjustment and management of other part of cardiac pacemaker: Secondary | ICD-10-CM

## 2012-04-25 DIAGNOSIS — J45909 Unspecified asthma, uncomplicated: Secondary | ICD-10-CM | POA: Insufficient documentation

## 2012-04-25 DIAGNOSIS — I442 Atrioventricular block, complete: Secondary | ICD-10-CM

## 2012-04-25 DIAGNOSIS — E039 Hypothyroidism, unspecified: Secondary | ICD-10-CM | POA: Insufficient documentation

## 2012-04-25 DIAGNOSIS — Q246 Congenital heart block: Secondary | ICD-10-CM

## 2012-04-25 HISTORY — PX: PERMANENT PACEMAKER GENERATOR CHANGE: SHX6022

## 2012-04-25 LAB — SURGICAL PCR SCREEN: Staphylococcus aureus: NEGATIVE

## 2012-04-25 SURGERY — PERMANENT PACEMAKER GENERATOR CHANGE
Anesthesia: LOCAL

## 2012-04-25 MED ORDER — FENTANYL CITRATE 0.05 MG/ML IJ SOLN
INTRAMUSCULAR | Status: AC
  Filled 2012-04-25: qty 2

## 2012-04-25 MED ORDER — LIDOCAINE HCL (PF) 1 % IJ SOLN
INTRAMUSCULAR | Status: AC
  Filled 2012-04-25: qty 60

## 2012-04-25 MED ORDER — MUPIROCIN 2 % EX OINT
TOPICAL_OINTMENT | Freq: Two times a day (BID) | CUTANEOUS | Status: DC
  Administered 2012-04-25: 06:00:00 via NASAL
  Administered 2012-04-25: 1 via NASAL
  Filled 2012-04-25: qty 22

## 2012-04-25 MED ORDER — ONDANSETRON HCL 4 MG/2ML IJ SOLN: 4.0000 mg | Freq: Four times a day (QID) | INTRAMUSCULAR | Status: DC | PRN

## 2012-04-25 MED ORDER — DIPHENHYDRAMINE HCL 50 MG/ML IJ SOLN
INTRAMUSCULAR | Status: AC
  Filled 2012-04-25: qty 1

## 2012-04-25 MED ORDER — ACETAMINOPHEN 325 MG PO TABS: 325.0000 mg | ORAL_TABLET | ORAL | Status: DC | PRN

## 2012-04-25 MED ORDER — MIDAZOLAM HCL 5 MG/5ML IJ SOLN
INTRAMUSCULAR | Status: AC
  Filled 2012-04-25: qty 5

## 2012-04-25 MED ORDER — DIPHENHYDRAMINE HCL 50 MG/ML IJ SOLN
25.0000 mg | Freq: Once | INTRAMUSCULAR | Status: AC
  Administered 2012-04-25: 25 mg via INTRAVENOUS

## 2012-04-25 MED ORDER — MUPIROCIN 2 % EX OINT
TOPICAL_OINTMENT | CUTANEOUS | Status: AC
  Filled 2012-04-25: qty 22

## 2012-04-25 NOTE — Op Note (Signed)
[name], [name]              ACCOUNT NO.:  [id number]  MEDICAL RECORD NO.:  [id number]  LOCATION:  [facility]                         FACILITY:  [facility]  PHYSICIAN:  Doylene Canning. [name], MD    DATE OF BIRTH:  [birth year]  DATE OF PROCEDURE:  04/25/2012 DATE OF DISCHARGE:  04/25/2012                              OPERATIVE REPORT   PROCEDURE PERFORMED:  Removal of previously implanted dual-chamber pacemaker which was at elective replacement and insertion of a new dual- chamber device.  INTRODUCTION:  The patient is a 52 year old woman with a history of complete heart block status post multiple pacemaker procedures.  She had an epicardial implant approximately 8 years ago and has reached elective replacement in her old device.  She is now referred for removal of her old device and insertion of new.  DESCRIPTION OF PROCEDURE:  After informed consent was obtained, the patient was taken to the diagnostic EP lab in a fasting state.  After usual preparation and draping, intravenous fentanyl and midazolam were given for sedation.  A 30 mL of lidocaine was infiltrated into the left upper quadrant region over the old pacemaker insertion site.  A 5-cm incision was carried out over this region.  Electrocautery was utilized to dissect down to the pacemaker pocket.  The generator was removed with gentle traction.  The atrial lead was removed and evaluated and found to be working satisfactorily at pacing threshold of 1.1 V at 0.4 milliseconds.  The pacing impedance was 300 ohms.  The ventricular lead was then evaluated.  It was removed from the generator.  There were no underlying R waves and the ventricular threshold was 1.3 V at 0.4 milliseconds and 1.1 V at 0.5 milliseconds.  The pacing impedance was 370 ohms.  With these satisfactory parameters, the new St. Jude Zephyr XL DR dual-chamber pacemaker, serial [id number], was connected to the atrial and ventricular leads and placed back in the subcutaneous  pocket. The pocket was irrigated with antibiotic irrigation.  The incision was closed with 2-0 and 3-0 Vicryl.  Benzoin and Steri-Strips were painted on the skin.  A pressure dressing was applied and the patient was returned to her room in satisfactory condition.  COMPLICATIONS:  There were no immediate procedure complications.  RESULTS:  Demonstrate successful removal of previously implanted dual- chamber pacemaker which reached elective replacement and insertion of a new dual-chamber device in a patient with underlying complete heart block.     Doylene Canning. [name], MD     GWT/MEDQ  D:  04/25/2012  T:  04/25/2012  Job:  [id number]

## 2012-04-25 NOTE — H&P (View-Only) (Signed)
 ELECTROPHYSIOLOGY OFFICE NOTE  Patient ID: [name] MRN: [id number], DOB/AGE: 52/03/1960   Date of Visit: 04/04/2012  Primary Physician: MACKENZIE,BRIAN, MD Primary Cardiologist: Gregg Taylor, MD Reason for Visit: EP/device follow-up  History of Present Illness  [name] is a 51 year old woman with congenital complete heart block s/p PPM initially implanted in early 1978 (subsequent atrial lead fracture/replacement 08/1976, gen change in1982 by Drs. Tysinger/Bowman, status post removal of leads, 08/1981, gen change 1990. 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 Bartle at [facility]), HTN and hypothyroidism who presents today for routine electrophysiology followup. Since last being seen in our clinic, she reports she is doing well. She has no complaints. She specifically denies chest pain or shortness of breath. She denies palpitations, dizziness, near syncope or syncope. She denies LE swelling, orthopnea, PND or recent weight gain.   Past Medical History Past Medical History  Diagnosis Date  . Presence of permanent cardiac pacemaker   . PVC (premature ventricular contraction)   . Asthma   . Hypertension   . Congenital complete AV block 1978  . Hypothyroidism     Past Surgical History Past Surgical History  Procedure Laterality Date  . Insert / replace / remove pacemaker      s/p PPM implant 1978 for congenital CHB      Allergies/Intolerances Allergies  Allergen Reactions  . Azithromycin   . Doxycycline   . Erythromycin   . Penicillins   . Sulfamethoxazole W-Trimethoprim     Current Home Medications Current Outpatient Prescriptions  Medication Sig Dispense Refill  . cephALEXin (KEFLEX) 500 MG capsule As needed dental work       . levothyroxine (SYNTHROID, LEVOTHROID) 88 MCG tablet Take 75 mcg by mouth daily.        . metoprolol (LOPRESSOR) 100 MG tablet Take 50 mg by mouth daily.          Social History Social History  . Marital Status: Married   Social History Main Topics  . Smoking status: Never Smoker   . Smokeless tobacco: Never Used  . Alcohol Use: No  . Drug Use: No   Review of Systems General: No chills, fever, night sweats or weight changes Cardiovascular: No chest pain, dyspnea on exertion, edema, orthopnea, palpitations, paroxysmal nocturnal dyspnea Dermatological: No rash, lesions or masses Respiratory: No cough, dyspnea Urologic: No hematuria, dysuria Abdominal: No nausea, vomiting, diarrhea, bright red blood per rectum, melena, or hematemesis Neurologic: No visual changes, weakness, changes in mental status All other systems reviewed and are otherwise negative except as noted above.  Physical Exam Blood pressure 136/78, pulse 70, height 5' 3" (1.6 m), weight 183 lb (83.008 kg).  General: Well developed, well appearing 51 year old female in no acute distress. HEENT: Normocephalic, atraumatic. EOMs intact. Sclera nonicteric. Oropharynx clear.  Neck: Supple without bruits. No JVD. Lungs: Respirations regular and unlabored, CTA bilaterally. No wheezes, rales or rhonchi. Heart: RRR. S1, S2 present. No murmurs, rub, S3 or S4. Abdomen: Soft, non-distended.  Extremities: No clubbing, cyanosis or edema. DP/PT/Radials 2+ and equal bilaterally. Psych: Normal affect. Neuro: Alert and oriented X 3. Moves all extremities spontaneously.   Diagnostics Device interrogation PPM battery check performed today shows battery at ERI since 03/28/2012  Assessment and Plan 1. PPM battery at ERI - [name] has an abdominal implant with epicardial leads and is pacemaker   dependent Discussed indications for PPM generator change. Discussed procedure in detail including risks and benefits. Risks and benefits include, but are not limited to, bleeding, infection and lead dislodgement. [name]  expressed verbal understanding and agrees to proceed.   Discussed/formulated this plan of care with Dr. Taylor via phone. Signed, Murriel Holwerda, PA-C 04/04/2012, 5:03 PM   

## 2012-04-25 NOTE — Op Note (Signed)
DDD PPM removal and insertion of a new device without immediate complication. [id number].

## 2012-04-25 NOTE — Interval H&P Note (Signed)
History and Physical Interval Note:  04/25/2012 7:19 AM  [name]  has presented today for surgery, with the diagnosis of end of life battery. ST JUDE. PT IS PACER DEPENDENT  The various methods of treatment have been discussed with the patient and family. After consideration of risks, benefits and other options for treatment, the patient has consented to  Procedure(s): PERMANENT PACEMAKER GENERATOR CHANGE (N/A) as a surgical intervention .  The patient's history has been reviewed, patient examined, no change in status, stable for surgery.  I have reviewed the patient's chart and labs.  Questions were answered to the patient's satisfaction.     Leonia Reeves.D.

## 2012-05-05 ENCOUNTER — Telehealth: Payer: Self-pay | Admitting: Internal Medicine

## 2012-05-05 NOTE — Telephone Encounter (Signed)
Will forward to device clinic  

## 2012-05-05 NOTE — Telephone Encounter (Signed)
Spoke w/pt and cancelled appt with Norma Fredrickson. Instructed pt only needed wound check appt unless having problems. Pt having no problems with device or device area.

## 2012-05-05 NOTE — Telephone Encounter (Signed)
New Prob   Pt would like to know if both of her appt on 3/28 are necessary. Would like to speak to nurse.

## 2012-05-05 NOTE — Telephone Encounter (Signed)
Pt would like to know if she needs to have the two appointments on 3/28 at 2:00  With Norma Fredrickson NP and 2:30 PM with [facility] for  Device and would check. Pt states her insurance will be billed for the two visits. Pt would like to Cancell the visit with Lawson Fiscal NP. I spoke with Baxter Hire and Gunnar Fusi at the device clinic regarding this, which they said that one of them  will check pt's wound and the device. Kristen paged [place] for more information regarding this issue.

## 2012-05-07 ENCOUNTER — Other Ambulatory Visit: Payer: Self-pay | Admitting: Internal Medicine

## 2012-05-07 ENCOUNTER — Encounter: Payer: Self-pay | Admitting: Internal Medicine

## 2012-05-07 ENCOUNTER — Ambulatory Visit (INDEPENDENT_AMBULATORY_CARE_PROVIDER_SITE_OTHER): Payer: BC Managed Care – PPO | Admitting: *Deleted

## 2012-05-07 DIAGNOSIS — I4891 Unspecified atrial fibrillation: Secondary | ICD-10-CM

## 2012-05-07 DIAGNOSIS — Q246 Congenital heart block: Secondary | ICD-10-CM

## 2012-05-07 LAB — PACEMAKER DEVICE OBSERVATION
AL AMPLITUDE: 0.5 mv
ATRIAL PACING PM: 97
BAMS-0001: 180 {beats}/min
BAMS-0003: 70 {beats}/min
DEVICE MODEL PM: 7424521
RV LEAD IMPEDENCE PM: 376 Ohm
RV LEAD THRESHOLD: 1 V
VENTRICULAR PACING PM: 99

## 2012-05-07 NOTE — Progress Notes (Signed)
Wound check-PPM 

## 2012-05-09 ENCOUNTER — Ambulatory Visit: Payer: BC Managed Care – PPO

## 2012-05-09 ENCOUNTER — Encounter: Payer: BC Managed Care – PPO | Admitting: Nurse Practitioner

## 2012-07-22 ENCOUNTER — Ambulatory Visit (INDEPENDENT_AMBULATORY_CARE_PROVIDER_SITE_OTHER): Payer: BC Managed Care – PPO | Admitting: Internal Medicine

## 2012-07-22 ENCOUNTER — Encounter: Payer: Self-pay | Admitting: Internal Medicine

## 2012-07-22 VITALS — BP 143/94 | HR 69 | Ht 63.0 in | Wt 186.0 lb

## 2012-07-22 DIAGNOSIS — Z950 Presence of cardiac pacemaker: Secondary | ICD-10-CM

## 2012-07-22 DIAGNOSIS — Q246 Congenital heart block: Secondary | ICD-10-CM

## 2012-07-22 LAB — PACEMAKER DEVICE OBSERVATION
AL IMPEDENCE PM: 323 Ohm
AL THRESHOLD: 1 V
BATTERY VOLTAGE: 2.79 V
RV LEAD IMPEDENCE PM: 382 Ohm
RV LEAD THRESHOLD: 1 V
VENTRICULAR PACING PM: 99

## 2012-07-22 NOTE — Assessment & Plan Note (Signed)
Her St. Jude dual-chamber pacemaker is working normally. We'll plan to recheck in several months. 

## 2012-07-22 NOTE — Patient Instructions (Addendum)
Your physician wants you to follow-up in: 04/2012 with Dr Court Joy will receive a reminder letter in the mail two months in advance. If you don't receive a letter, please call our office to schedule the follow-up appointment.

## 2012-07-22 NOTE — Progress Notes (Signed)
HPI [name] returns today for followup. She is a very pleasant 52 year old woman with a history of congenital complete heart block, status post permanent pacemaker insertion. She recently underwent pacemaker generator change. In the interim, she has done well. She denies chest pain, shortness of breath, or syncope. Minimal peripheral edema. This is associated with prolonged upright activity.  Allergies  Allergen Reactions  . Azithromycin Rash  . Doxycycline Rash  . Erythromycin Nausea And Vomiting  . Penicillins Rash  . Sulfamethoxazole W-Trimethoprim Rash     Current Outpatient Prescriptions  Medication Sig Dispense Refill  . calcium carbonate (TUMS - DOSED IN MG ELEMENTAL CALCIUM) 500 MG chewable tablet Chew 1 tablet by mouth daily.      . cephALEXin (KEFLEX) 500 MG capsule As needed dental work       . diphenhydrAMINE (SOMINEX) 25 MG tablet Take 25 mg by mouth at bedtime as needed for sleep.      Marland Kitchen ibuprofen (ADVIL,MOTRIN) 200 MG tablet Take 200 mg by mouth every 6 (six) hours as needed for pain.      Marland Kitchen levothyroxine (SYNTHROID, LEVOTHROID) 75 MCG tablet Take 75 mcg by mouth daily.      . metoprolol (LOPRESSOR) 100 MG tablet Take 50 mg by mouth daily.         No current facility-administered medications for this visit.     Past Medical History  Diagnosis Date  . Presence of permanent cardiac pacemaker   . PVC (premature ventricular contraction)   . Asthma   . Hypertension   . Congenital complete AV block 1978  . Hypothyroidism     ROS:   All systems reviewed and negative except as noted in the HPI.   Past Surgical History  Procedure Laterality Date  . Insert / replace / remove pacemaker      s/p PPM implant 1978 for congenital CHB      No family history on file.   History   Social History  . Marital Status: Married    Spouse Name: N/A    Number of Children: N/A  . Years of Education: N/A   Occupational History  . Not on file.   Social History Main  Topics  . Smoking status: Never Smoker   . Smokeless tobacco: Never Used  . Alcohol Use: No  . Drug Use: No  . Sexually Active: Not on file   Other Topics Concern  . Not on file   Social History Narrative  . No narrative on file     BP 143/94  Pulse 69  Ht 5\' 3"  (1.6 m)  Wt 186 lb (84.369 kg)  BMI 32.96 kg/m2  Physical Exam:  Well appearing middle-age woman,NAD HEENT: Unremarkable Neck:  7 cm JVD, no thyromegally Lungs:  Clear with no wheezes, rales, or rhonchi. HEART:  Regular rate rhythm, no murmurs, no rubs, no clicks Abd:  soft, positive bowel sounds, no organomegally, no rebound, no guarding Ext:  2 plus pulses, no edema, no cyanosis, no clubbing Skin:  No rashes no nodules Neuro:  CN II through XII intact, motor grossly intact  EKG sinus rhythm with AV sequential pacing  DEVICE  Normal device function.  See PaceArt for details.   Assess/Plan:

## 2012-12-18 ENCOUNTER — Other Ambulatory Visit: Payer: Self-pay

## 2013-04-16 ENCOUNTER — Other Ambulatory Visit: Payer: Self-pay | Admitting: Registered Nurse

## 2013-04-16 ENCOUNTER — Other Ambulatory Visit (HOSPITAL_COMMUNITY)
Admission: RE | Admit: 2013-04-16 | Discharge: 2013-04-16 | Disposition: A | Discharge status: Home / Self Care | Payer: BC Managed Care – PPO | Source: Ambulatory Visit | Attending: Internal Medicine | Admitting: Internal Medicine

## 2013-04-16 DIAGNOSIS — Z01419 Encounter for gynecological examination (general) (routine) without abnormal findings: Secondary | ICD-10-CM | POA: Insufficient documentation

## 2013-04-30 ENCOUNTER — Encounter: Payer: Self-pay | Admitting: *Deleted

## 2013-07-17 ENCOUNTER — Encounter: Payer: Self-pay | Admitting: Cardiology

## 2013-07-20 ENCOUNTER — Telehealth: Payer: Self-pay | Admitting: Internal Medicine

## 2013-07-20 NOTE — Telephone Encounter (Signed)
07-20-13 certified letter sent regarding pt's past due device check/mt ° °

## 2013-08-19 ENCOUNTER — Encounter: Payer: Self-pay | Admitting: Internal Medicine

## 2013-08-19 ENCOUNTER — Ambulatory Visit (INDEPENDENT_AMBULATORY_CARE_PROVIDER_SITE_OTHER): Payer: BC Managed Care – PPO | Admitting: Internal Medicine

## 2013-08-19 VITALS — BP 140/77 | HR 74 | Ht 63.0 in | Wt 184.0 lb

## 2013-08-19 DIAGNOSIS — Q246 Congenital heart block: Secondary | ICD-10-CM

## 2013-08-19 DIAGNOSIS — I4949 Other premature depolarization: Secondary | ICD-10-CM

## 2013-08-19 DIAGNOSIS — I442 Atrioventricular block, complete: Secondary | ICD-10-CM

## 2013-08-19 DIAGNOSIS — Z950 Presence of cardiac pacemaker: Secondary | ICD-10-CM

## 2013-08-19 LAB — MDC_IDC_ENUM_SESS_TYPE_INCLINIC
Battery Voltage: 2.79 V
Implantable Pulse Generator Model: 5826
Implantable Pulse Generator Serial Number: 7424521
Lead Channel Pacing Threshold Amplitude: 0.75 V
Lead Channel Pacing Threshold Amplitude: 1 V
Lead Channel Pacing Threshold Pulse Width: 0.5 ms
Lead Channel Setting Pacing Amplitude: 2 V
Lead Channel Setting Pacing Amplitude: 2.5 V
Lead Channel Setting Pacing Pulse Width: 0.5 ms
Lead Channel Setting Sensing Sensitivity: 6 mV
MDC IDC MSMT BATTERY IMPEDANCE: 1000 Ohm — AB
MDC IDC MSMT LEADCHNL RA IMPEDANCE VALUE: 310 Ohm
MDC IDC MSMT LEADCHNL RA PACING THRESHOLD PULSEWIDTH: 0.4 ms
MDC IDC MSMT LEADCHNL RA SENSING INTR AMPL: 0.7 mV
MDC IDC MSMT LEADCHNL RV IMPEDANCE VALUE: 376 Ohm
MDC IDC SESS DTM: 20150708152657
MDC IDC STAT BRADY RA PERCENT PACED: 93 %
MDC IDC STAT BRADY RV PERCENT PACED: 99 %

## 2013-08-19 NOTE — Assessment & Plan Note (Addendum)
Her symptoms are infrequent. Will follow. She will continue her low dose beta blocker.

## 2013-08-19 NOTE — Assessment & Plan Note (Signed)
Her St. Jude DDD device is working normally. Will recheck in several months. 

## 2013-08-19 NOTE — Patient Instructions (Signed)
Your physician recommends that you continue on your current medications as directed. Please refer to the Current Medication list given to you today.  Your physician wants you to follow-up in: 6 months with device clinic.  You will receive a reminder letter in the mail two months in advance. If you don't receive a letter, please call our office to schedule the follow-up appointment.  Your physician wants you to follow-up in: 1 year with Dr. Taylor. You will receive a reminder letter in the mail two months in advance. If you don't receive a letter, please call our office to schedule the follow-up appointment.  

## 2013-08-19 NOTE — Progress Notes (Signed)
HPI [name] returns today for followup. She is a very pleasant 53 year old woman with a history of congenital complete heart block, status post permanent pacemaker insertion. She recently underwent pacemaker generator change. In the interim, she has done well. She denies chest pain, shortness of breath, or syncope. Minimal peripheral edema. This is associated with prolonged upright activity. She notes that she is "lazy". Allergies  Allergen Reactions  . Azithromycin Rash  . Doxycycline Rash  . Erythromycin Nausea And Vomiting  . Penicillins Rash  . Sulfamethoxazole-Trimethoprim Rash     Current Outpatient Prescriptions  Medication Sig Dispense Refill  . calcium carbonate (TUMS - DOSED IN MG ELEMENTAL CALCIUM) 500 MG chewable tablet Chew 1 tablet by mouth as needed.       . cephALEXin (KEFLEX) 500 MG capsule As needed dental work       . diphenhydrAMINE (SOMINEX) 25 MG tablet Take 25 mg by mouth at bedtime as needed for sleep.      Marland Kitchen. ibuprofen (ADVIL,MOTRIN) 200 MG tablet Take 200 mg by mouth every 6 (six) hours as needed for pain.      Marland Kitchen. levothyroxine (SYNTHROID, LEVOTHROID) 75 MCG tablet Take 75 mcg by mouth daily.      . metoprolol (LOPRESSOR) 100 MG tablet Take 50 mg by mouth daily.         No current facility-administered medications for this visit.     Past Medical History  Diagnosis Date  . Presence of permanent cardiac pacemaker   . PVC (premature ventricular contraction)   . Asthma   . Hypertension   . Congenital complete AV block 1978  . Hypothyroidism     ROS:   All systems reviewed and negative except as noted in the HPI.   Past Surgical History  Procedure Laterality Date  . Insert / replace / remove pacemaker      s/p PPM implant 1978 for congenital CHB      No family history on file.   History   Social History  . Marital Status: Married    Spouse Name: N/A    Number of Children: N/A  . Years of Education: N/A   Occupational History  . Not on  file.   Social History Main Topics  . Smoking status: Never Smoker   . Smokeless tobacco: Never Used  . Alcohol Use: No  . Drug Use: No  . Sexual Activity: Not on file   Other Topics Concern  . Not on file   Social History Narrative  . No narrative on file     BP 140/77  Pulse 74  Ht 5\' 3"  (1.6 m)  Wt 184 lb (83.462 kg)  BMI 32.60 kg/m2  Physical Exam:  Well appearing middle-age woman,NAD HEENT: Unremarkable Neck:  7 cm JVD, no thyromegally Lungs:  Clear with no wheezes, rales, or rhonchi. HEART:  Regular rate rhythm, no murmurs, no rubs, no clicks Abd:  soft, positive bowel sounds, no organomegally, no rebound, no guarding Ext:  2 plus pulses, no edema, no cyanosis, no clubbing Skin:  No rashes no nodules Neuro:  CN II through XII intact, motor grossly intact   DEVICE  Normal device function.  See PaceArt for details.   Assess/Plan:

## 2014-01-21 ENCOUNTER — Encounter (HOSPITAL_COMMUNITY): Payer: Self-pay | Admitting: Internal Medicine

## 2014-02-23 ENCOUNTER — Telehealth: Payer: Self-pay | Admitting: Internal Medicine

## 2014-02-23 NOTE — Telephone Encounter (Signed)
New message      Request for surgical clearance:  1. What type of surgery is being performed? colonscopy  2. When is this surgery scheduled? 02-26-14  3. Are there any medications that need to be held prior to surgery and how long?no----should pt have an antibiotic prior to procedure because she has a pacemaker in her left abd and she has had endocarditis  4. Name of physician performing surgery? Dr Loreta AveMann  5. What is your office phone and fax number?

## 2014-02-24 NOTE — Telephone Encounter (Signed)
Discussed with Dr Ladona Ridgelaylor, okay to proceed with colonoscopy.

## 2014-02-25 ENCOUNTER — Telehealth: Payer: Self-pay | Admitting: Internal Medicine

## 2014-02-25 NOTE — Telephone Encounter (Signed)
New message      Pt is calling to see if Dr Ladona Ridgelaylor responded to the message regarding her procedure scheduled for tomorrow.  Please call

## 2014-02-25 NOTE — Telephone Encounter (Signed)
Okay to proceed with colonoscopy.  No need for antibiotics Patient aware

## 2014-02-26 NOTE — Telephone Encounter (Signed)
Spoke with Dr. Loreta AveMann and informed her of Dr. Lubertha Basqueaylor's recommendations.  Printed and faxed to [phone].

## 2014-03-18 ENCOUNTER — Encounter: Payer: Self-pay | Admitting: *Deleted

## 2015-06-16 ENCOUNTER — Encounter: Payer: Self-pay | Admitting: Internal Medicine

## 2015-06-16 ENCOUNTER — Ambulatory Visit (INDEPENDENT_AMBULATORY_CARE_PROVIDER_SITE_OTHER): Payer: Managed Care, Other (non HMO) | Admitting: *Deleted

## 2015-06-16 DIAGNOSIS — I442 Atrioventricular block, complete: Secondary | ICD-10-CM

## 2015-06-16 NOTE — Progress Notes (Signed)
Pacemaker check in clinic. Normal device function. Thresholds, sensing, impedances consistent with previous measurements. Device programmed to maximize longevity. No mode switch or high ventricular rates noted. Device programmed at appropriate safety margins. Histogram distribution appropriate for patient activity level. Device programmed to optimize intrinsic conduction. Estimated longevity 5-6.50 years. ROV with GT in 10/07/2015. Patient education completed.

## 2015-07-22 LAB — CUP PACEART INCLINIC DEVICE CHECK
Brady Statistic RA Percent Paced: 93 %
Implantable Lead Implant Date: 20070629
Implantable Lead Location: 753860
Implantable Lead Model: 511212
Lead Channel Impedance Value: 360 Ohm
Lead Channel Pacing Threshold Amplitude: 1 V
Lead Channel Pacing Threshold Pulse Width: 0.5 ms
Lead Channel Sensing Intrinsic Amplitude: 0.3 mV
Lead Channel Setting Pacing Amplitude: 2 V
Lead Channel Setting Pacing Pulse Width: 0.5 ms
Lead Channel Setting Sensing Sensitivity: 6 mV
MDC IDC LEAD IMPLANT DT: 20070629
MDC IDC LEAD LOCATION: 753859
MDC IDC LEAD SERIAL: 109900
MDC IDC LEAD SERIAL: 109901
MDC IDC MSMT BATTERY IMPEDANCE: 1000 Ohm — AB
MDC IDC MSMT BATTERY VOLTAGE: 2.78 V
MDC IDC MSMT LEADCHNL RA IMPEDANCE VALUE: 315 Ohm
MDC IDC MSMT LEADCHNL RA PACING THRESHOLD AMPLITUDE: 0.75 V
MDC IDC MSMT LEADCHNL RA PACING THRESHOLD PULSEWIDTH: 0.4 ms
MDC IDC SESS DTM: 20170504133231
MDC IDC SET LEADCHNL RV PACING AMPLITUDE: 2.5 V
MDC IDC STAT BRADY RV PERCENT PACED: 99 %
Pulse Gen Serial Number: 7424521

## 2015-09-26 ENCOUNTER — Encounter: Payer: Self-pay | Admitting: Internal Medicine

## 2015-10-07 ENCOUNTER — Ambulatory Visit (INDEPENDENT_AMBULATORY_CARE_PROVIDER_SITE_OTHER): Payer: Managed Care, Other (non HMO) | Admitting: Internal Medicine

## 2015-10-07 ENCOUNTER — Encounter: Payer: Self-pay | Admitting: Internal Medicine

## 2015-10-07 DIAGNOSIS — I10 Essential (primary) hypertension: Secondary | ICD-10-CM

## 2015-10-07 DIAGNOSIS — Z950 Presence of cardiac pacemaker: Secondary | ICD-10-CM | POA: Diagnosis not present

## 2015-10-07 NOTE — Patient Instructions (Signed)
Medication Instructions:  Your physician recommends that you continue on your current medications as directed. Please refer to the Current Medication list given to you today.   Labwork: None Ordered   Testing/Procedures: None Ordered   Follow-Up: Your physician wants you to follow-up in: 6 months with Device Clinic.  You will receive a reminder letter in the mail two months in advance. If you don't receive a letter, please call our office to schedule the follow-up appointment.   Your physician wants you to follow-up in: 1 year with Dr. Ladona Ridgelaylor.  You will receive a reminder letter in the mail two months in advance. If you don't receive a letter, please call our office to schedule the follow-up appointment.   If you need a refill on your cardiac medications before your next appointment, please call your pharmacy.   Thank you for choosing [facility]! Eligha BridegroomMichelle Onna Nodal, RN [phone]

## 2015-10-07 NOTE — Progress Notes (Signed)
HPI [name] returns today for followup. She is a very pleasant 55 year old woman with a history of congenital complete heart block, status post permanent pacemaker insertion. She recently underwent pacemaker generator change. She has an epicardial system. In the interim, she has done well. She denies chest pain, shortness of breath, or syncope. Minimal peripheral edema. This is associated with prolonged upright activity. She is working 45 hours a week.  Allergies  Allergen Reactions  . Azithromycin Rash  . Doxycycline Rash  . Erythromycin Nausea And Vomiting  . Penicillins Rash  . Sulfamethoxazole-Trimethoprim Rash     Current Outpatient Prescriptions  Medication Sig Dispense Refill  . calcium carbonate (TUMS - DOSED IN MG ELEMENTAL CALCIUM) 500 MG chewable tablet Chew 1 tablet by mouth as needed.     . cephALEXin (KEFLEX) 500 MG capsule As needed dental work     . diphenhydrAMINE (SOMINEX) 25 MG tablet Take 25 mg by mouth at bedtime as needed for sleep.    . furosemide (LASIX) 20 MG tablet Take 20 mg by mouth daily.    Marland Kitchen ibuprofen (ADVIL,MOTRIN) 200 MG tablet Take 200 mg by mouth every 6 (six) hours as needed for pain.    Marland Kitchen levothyroxine (SYNTHROID, LEVOTHROID) 88 MCG tablet Take 88 mcg by mouth daily before breakfast.     . metoprolol (LOPRESSOR) 100 MG tablet Take 50 mg by mouth daily.       No current facility-administered medications for this visit.      Past Medical History:  Diagnosis Date  . Asthma   . Congenital complete AV block 1978  . Hypertension   . Hypothyroidism   . Presence of permanent cardiac pacemaker   . PVC (premature ventricular contraction)     ROS:   All systems reviewed and negative except as noted in the HPI.   Past Surgical History:  Procedure Laterality Date  . INSERT / REPLACE / REMOVE PACEMAKER     s/p PPM implant 1978 for congenital CHB   . PERMANENT PACEMAKER GENERATOR CHANGE N/A 04/25/2012   Procedure: PERMANENT PACEMAKER GENERATOR  CHANGE;  Surgeon: Marinus Maw, MD;  Location: [facility] CATH LAB;  Service: Cardiovascular;  Laterality: N/A;     No family history on file.   Social History   Social History  . Marital status: Married    Spouse name: N/A  . Number of children: N/A  . Years of education: N/A   Occupational History  . Not on file.   Social History Main Topics  . Smoking status: Never Smoker  . Smokeless tobacco: Never Used  . Alcohol use No  . Drug use: No  . Sexual activity: Not on file   Other Topics Concern  . Not on file   Social History Narrative  . No narrative on file     BP 126/60   Pulse 70   Ht 5\' 3"  (1.6 m)   Wt 199 lb (90.3 kg)   BMI 35.25 kg/m   Physical Exam:  Well appearing middle-age woman,NAD HEENT: Unremarkable Neck:  7 cm JVD, no thyromegally Lungs:  Clear with no wheezes, rales, or rhonchi. HEART:  Regular rate rhythm, no murmurs, no rubs, no clicks Abd:  soft, positive bowel sounds, no organomegally, no rebound, no guarding, well healed PM incision. Ext:  2 plus pulses, no edema, no cyanosis, no clubbing Skin:  No rashes no nodules Neuro:  CN II through XII intact, motor grossly intact   DEVICE  Normal device function.  See PaceArt  for details.   Assess/Plan: 1. Congenital complete heart block - she is doing well s/p PPM insertion 2. PPM - Her St. Jude DDD PM is working normally.  3. HTN - her blood pressure is well controlled. Will follow.  Leonia ReevesGregg Taylor,M.D.

## 2016-07-04 ENCOUNTER — Ambulatory Visit (INDEPENDENT_AMBULATORY_CARE_PROVIDER_SITE_OTHER): Payer: Managed Care, Other (non HMO) | Admitting: *Deleted

## 2016-07-04 VITALS — BP 136/76 | HR 70

## 2016-07-04 DIAGNOSIS — Z950 Presence of cardiac pacemaker: Secondary | ICD-10-CM

## 2016-07-04 DIAGNOSIS — I442 Atrioventricular block, complete: Secondary | ICD-10-CM | POA: Diagnosis not present

## 2016-07-04 LAB — CUP PACEART INCLINIC DEVICE CHECK
Battery Impedance: 1000 Ohm
Brady Statistic RA Percent Paced: 88 %
Brady Statistic RV Percent Paced: 99 %
Implantable Lead Implant Date: 20070629
Implantable Lead Location: 753860
Implantable Lead Model: 511212
Implantable Lead Serial Number: 109901
Implantable Pulse Generator Implant Date: 20140314
Lead Channel Impedance Value: 305 Ohm
Lead Channel Impedance Value: 357 Ohm
Lead Channel Pacing Threshold Amplitude: 0.5 V
Lead Channel Pacing Threshold Amplitude: 1 V
Lead Channel Sensing Intrinsic Amplitude: 0.4 mV
Lead Channel Setting Pacing Amplitude: 2 V
MDC IDC LEAD IMPLANT DT: 20070629
MDC IDC LEAD LOCATION: 753859
MDC IDC LEAD SERIAL: 109900
MDC IDC MSMT BATTERY VOLTAGE: 2.78 V
MDC IDC MSMT LEADCHNL RA PACING THRESHOLD PULSEWIDTH: 0.4 ms
MDC IDC MSMT LEADCHNL RV PACING THRESHOLD PULSEWIDTH: 0.5 ms
MDC IDC PG SERIAL: 7424521
MDC IDC SESS DTM: 20180523093648
MDC IDC SET LEADCHNL RV PACING AMPLITUDE: 2.5 V
MDC IDC SET LEADCHNL RV PACING PULSEWIDTH: 0.5 ms
MDC IDC SET LEADCHNL RV SENSING SENSITIVITY: 6 mV

## 2016-07-04 NOTE — Progress Notes (Signed)
Patient seen today in device clinic for device check.   The patient denies chest pain, palpitations, dyspnea, dizziness, syncope, edema, weight gain, fevers or chills.  BP 136/76 (BP Location: Right Arm, Patient Position: Sitting, Cuff Size: Large)   Pulse 70   Pacemaker check in clinic. Normal device function. Thresholds, sensing, impedances consistent with previous measurements. Device programmed to maximize longevity. 1 mode switch (<1%)--peak A 183bpm, duration 8sec. No episode triggers enabled. Device programmed at appropriate safety margins. Histogram distribution appropriate for patient activity level. Device programmed to optimize intrinsic conduction. Estimated longevity 4.25-5.75 years.  Patient followed in clinic only, PPM not remote-capable. Appropriate patient education provided.   Follow up with Dr. Ladona Ridgelaylor on 10/10/16 as scheduled.  [name], [name]  07/04/2016 9:35 AM

## 2016-07-13 ENCOUNTER — Encounter: Payer: Self-pay | Admitting: Family Medicine

## 2016-07-13 ENCOUNTER — Ambulatory Visit (INDEPENDENT_AMBULATORY_CARE_PROVIDER_SITE_OTHER): Payer: Managed Care, Other (non HMO) | Admitting: Family Medicine

## 2016-07-13 VITALS — BP 138/77 | HR 72 | Temp 97.9°F | Resp 18 | Ht 62.99 in | Wt 201.0 lb

## 2016-07-13 DIAGNOSIS — R21 Rash and other nonspecific skin eruption: Secondary | ICD-10-CM

## 2016-07-13 DIAGNOSIS — W57XXXA Bitten or stung by nonvenomous insect and other nonvenomous arthropods, initial encounter: Secondary | ICD-10-CM

## 2016-07-13 MED ORDER — DIPHENHYDRAMINE HCL 25 MG PO CAPS: 25.0000 mg | ORAL_CAPSULE | Freq: Four times a day (QID) | ORAL | 0 refills | Status: DC | PRN

## 2016-07-13 MED ORDER — HYDROCORTISONE 1 % EX LOTN: 1.0000 "application " | TOPICAL_LOTION | Freq: Two times a day (BID) | CUTANEOUS | 0 refills | Status: DC

## 2016-07-13 NOTE — Progress Notes (Signed)
Chief Complaint  Patient presents with  . Insect Bite    was bite by a tick on 5/30 and have a red spot on the left thigh    HPI   Pt reports that she had a tick bite on 07/11/16 She reports that she removed the tick on the 30th She reports that she applied warm compress and astringent She reports that she removed the tick less than 6 hours She reports that the tick remained small  Past Medical History:  Diagnosis Date  . Asthma   . Congenital complete AV block 1978  . Hypertension   . Hypothyroidism   . Presence of permanent cardiac pacemaker   . PVC (premature ventricular contraction)     Current Outpatient Prescriptions  Medication Sig Dispense Refill  . calcium carbonate (TUMS - DOSED IN MG ELEMENTAL CALCIUM) 500 MG chewable tablet Chew 1 tablet by mouth as needed.     . cephALEXin (KEFLEX) 500 MG capsule As needed dental work     . diphenhydrAMINE (BENADRYL) 25 mg capsule Take 1 capsule (25 mg total) by mouth every 6 (six) hours as needed. 30 capsule 0  . diphenhydrAMINE (SOMINEX) 25 MG tablet Take 25 mg by mouth at bedtime as needed for sleep.    . furosemide (LASIX) 20 MG tablet Take 20 mg by mouth daily.    . hydrocortisone 1 % lotion Apply 1 application topically 2 (two) times daily. 118 mL 0  . ibuprofen (ADVIL,MOTRIN) 200 MG tablet Take 200 mg by mouth every 6 (six) hours as needed for pain.    Marland Kitchen. levothyroxine (SYNTHROID, LEVOTHROID) 88 MCG tablet Take 88 mcg by mouth daily before breakfast.     . metoprolol (LOPRESSOR) 100 MG tablet Take 50 mg by mouth daily.       No current facility-administered medications for this visit.     Allergies:  Allergies  Allergen Reactions  . Azithromycin Rash  . Doxycycline Rash  . Erythromycin Nausea And Vomiting  . Penicillins Rash  . Sulfamethoxazole-Trimethoprim Rash    Past Surgical History:  Procedure Laterality Date  . INSERT / REPLACE / REMOVE PACEMAKER     s/p PPM implant 1978 for congenital CHB   . PERMANENT  PACEMAKER GENERATOR CHANGE N/A 04/25/2012   Procedure: PERMANENT PACEMAKER GENERATOR CHANGE;  Surgeon: Marinus MawGregg W Taylor, MD;  [facility];  Service: Cardiovascular;  Laterality: N/A;    Social History   Social History  . Marital status: Married    Spouse name: N/A  . Number of children: N/A  . Years of education: N/A   Social History Main Topics  . Smoking status: Never Smoker  . Smokeless tobacco: Never Used  . Alcohol use No  . Drug use: No  . Sexual activity: Not Asked   Other Topics Concern  . None   Social History Narrative  . None    Review of Systems  Constitutional: Negative for chills, fever and malaise/fatigue.  Cardiovascular: Negative for chest pain and palpitations.  Skin: Positive for itching and rash.    Objective: Vitals:   07/13/16 1339  BP: 138/77  Pulse: 72  Resp: 18  Temp: 97.9 F (36.6 C)  TempSrc: Oral  SpO2: 95%  Weight: 201 lb (91.2 kg)  Height: 5' 2.99" (1.6 m)   Body mass index is 35.61 kg/m.  Physical Exam Gen: alert and oriented Left thigh with area of concern with a central pore mild erythema No pustule No nodule  Assessment and Plan [facility]  [facility] today for insect bite.  Diagnoses and all orders for this visit:  Tick bite, initial encounter  Advised pt to use topical hydrocortisone Take benadryl Reviewed CDC.GOV/TICK for more information -     hydrocortisone 1 % lotion; Apply 1 application topically 2 (two) times daily. -     diphenhydrAMINE (BENADRYL) 25 mg capsule; Take 1 capsule (25 mg total) by mouth every 6 (six) hours as needed.     Tomicka Lover A Arwa Yero

## 2016-07-13 NOTE — Patient Instructions (Addendum)
   IF you received an x-ray today, you will receive an invoice from [facility]. Please contact [facility] at [phone] with questions or concerns regarding your invoice.   IF you received labwork today, you will receive an invoice from [facility]. Please contact [facility] at [phone] with questions or concerns regarding your invoice.   Our billing staff will not be able to assist you with questions regarding bills from these companies.  You will be contacted with the lab results as soon as they are available. The fastest way to get your results is to activate your My Chart account. Instructions are located on the last page of this paperwork. If you have not heard from us regarding the results in 2 weeks, please contact this office.     Tick Bite Information, Adult Ticks are insects that draw blood for food. Most ticks live in shrubs and grassy areas. They climb onto people and animals that brush against the leaves and grasses that they rest on. Then they bite, attaching themselves to the skin. Most ticks are harmless, but some ticks carry germs that can spread to a person through a bite and cause a disease. To reduce your risk of getting a disease from a tick bite, it is important to take steps to prevent tick bites. It is also important to check for ticks after being outdoors. If you find that a tick has attached to you, watch for symptoms of disease. How can I prevent tick bites? Take these steps to help prevent tick bites when you are outdoors in an area where ticks are found:  Use insect repellent that has DEET (20% or higher), picaridin, or IR3535 in it. Use it on: ? Skin that is showing. ? The top of your boots. ? Your pant legs. ? Your sleeve cuffs.  For repellent products that contain permethrin, follow product instructions. Use these products on: ? Clothing. ? Gear. ? Boots. ? Tents.  Wear protective clothing. Long sleeves and long pants offer the  best protection from ticks.  Wear light-colored clothing so you can see ticks more easily.  Tuck your pant legs into your socks.  If you go walking on a trail, stay in the middle of the trail so your skin, hair, and clothing do not touch the bushes.  Avoid walking through areas with long grass.  Check for ticks on your clothing, hair, and skin often while you are outside, and check again before you go inside. Make sure to check the places that ticks attach themselves most often. These places include the scalp, neck, armpits, waist, groin, and joint areas. Ticks that carry a disease called Lyme disease have to be attached to the skin for 24-48 hours. Checking for ticks every day will lessen your risk of this and other diseases.  When you come indoors, wash your clothes and take a shower or a bath right away. Dry your clothes in a dryer on high heat for at least 60 minutes. This will kill any ticks in your clothes.  What is the proper way to remove a tick? If you find a tick on your body, remove it as soon as possible. Removing a tick sooner rather than later can prevent germs from passing from the tick to your body. To remove a tick that is crawling on your skin but has not bitten:  Go outdoors and brush the tick off.  Remove the tick with tape or a lint roller.  To remove a tick that   is attached to your skin:  Wash your hands.  If you have latex gloves, put them on.  Use tweezers, curved forceps, or a tick-removal tool to gently grasp the tick as close to your skin and the tick's head as possible.  Gently pull with steady, upward pressure until the tick lets go. When removing the tick: ? Take care to keep the tick's head attached to its body. ? Do not twist or jerk the tick. This can make the tick's head or mouth break off. ? Do not squeeze or crush the tick's body. This could force disease-carrying fluids from the tick into your body.  Do not try to remove a tick with heat,  alcohol, petroleum jelly, or fingernail polish. Using these methods can cause the tick to salivate and regurgitate into your bloodstream, increasing your risk of getting a disease. What should I do after removing a tick?  Clean the bite area with soap and water, rubbing alcohol, or an iodine scrub.  If an antiseptic cream or ointment is available, apply a small amount to the bite site.  Wash and disinfect any instruments that you used to remove the tick. How should I dispose of a tick? To dispose of a live tick, use one of these methods:  Place it in rubbing alcohol.  Place it in a sealed bag or container.  Wrap it tightly in tape.  Flush it down the toilet.  Contact a health care provider if:  You have symptoms of a disease after a tick bite. Symptoms of a tick-borne disease can occur from moments after the tick bites to up to 30 days after a tick is removed. Symptoms include: ? Muscle, joint, or bone pain. ? Difficulty walking or moving your legs. ? Numbness in the legs. ? Paralysis. ? Red rash around the tick bite area that is shaped like a target or a "bull's-eye." ? Redness and swelling in the area of the tick bite. ? Fever. ? Repeated vomiting. ? Diarrhea. ? Weight loss. ? Tender, swollen lymph glands. ? Shortness of breath. ? Cough. ? Pain in the abdomen. ? Headache. ? Abnormal tiredness. ? A change in your level of consciousness. ? Confusion. Get help right away if:  You are not able to remove a tick.  A part of a tick breaks off and gets stuck in your skin.  Your symptoms get worse. Summary  Ticks may carry germs that can spread to a person through a bite and cause disease.  Wear protective clothing and use insect repellent to prevent tick bites. Follow product instructions.  If you find a tick on your body, remove it as soon as possible. If the tick is attached, do not try to remove with heat, alcohol, petroleum jelly, or fingernail polish.  Remove the  attached tick using tweezers, curved forceps, or a tick-removal tool. Gently pull with steady, upward pressure until the tick lets go. Do not twist or jerk the tick. Do not squeeze or crush the tick's body.  If you have symptoms after being bitten by a tick, contact a health care provider. This information is not intended to replace advice given to you by your health care provider. Make sure you discuss any questions you have with your health care provider. Document Released: 01/27/2000 Document Revised: 11/11/2015 Document Reviewed: 11/11/2015 Elsevier Interactive Patient Education  2018 Elsevier Inc.  

## 2016-09-24 ENCOUNTER — Encounter: Payer: Self-pay | Admitting: Internal Medicine

## 2016-10-10 ENCOUNTER — Ambulatory Visit (INDEPENDENT_AMBULATORY_CARE_PROVIDER_SITE_OTHER): Payer: Managed Care, Other (non HMO) | Admitting: Internal Medicine

## 2016-10-10 ENCOUNTER — Encounter: Payer: Self-pay | Admitting: Internal Medicine

## 2016-10-10 VITALS — BP 136/82 | HR 70 | Ht 63.0 in | Wt 202.4 lb

## 2016-10-10 DIAGNOSIS — I442 Atrioventricular block, complete: Secondary | ICD-10-CM | POA: Diagnosis not present

## 2016-10-10 LAB — CUP PACEART INCLINIC DEVICE CHECK
Brady Statistic RV Percent Paced: 99 %
Implantable Lead Location: 753860
Implantable Lead Model: 511212
Implantable Lead Model: 511212
Implantable Lead Serial Number: 109901
Implantable Pulse Generator Implant Date: 20140314
Lead Channel Impedance Value: 379 Ohm
Lead Channel Pacing Threshold Amplitude: 1 V
Lead Channel Pacing Threshold Amplitude: 1.25 V
Lead Channel Sensing Intrinsic Amplitude: 0.3 mV
Lead Channel Setting Sensing Sensitivity: 6 mV
MDC IDC LEAD IMPLANT DT: 20070629
MDC IDC LEAD IMPLANT DT: 20070629
MDC IDC LEAD LOCATION: 753859
MDC IDC LEAD SERIAL: 109900
MDC IDC MSMT BATTERY IMPEDANCE: 1100 Ohm
MDC IDC MSMT BATTERY VOLTAGE: 2.76 V
MDC IDC MSMT LEADCHNL RA IMPEDANCE VALUE: 300 Ohm
MDC IDC MSMT LEADCHNL RA PACING THRESHOLD PULSEWIDTH: 0.4 ms
MDC IDC MSMT LEADCHNL RV PACING THRESHOLD PULSEWIDTH: 0.5 ms
MDC IDC SESS DTM: 20180829123901
MDC IDC SET LEADCHNL RA PACING AMPLITUDE: 2 V
MDC IDC SET LEADCHNL RV PACING AMPLITUDE: 2.5 V
MDC IDC SET LEADCHNL RV PACING PULSEWIDTH: 0.5 ms
MDC IDC STAT BRADY RA PERCENT PACED: 90 %
Pulse Gen Serial Number: 7424521

## 2016-10-10 NOTE — Patient Instructions (Addendum)
Medication Instructions:  Your physician recommends that you continue on your current medications as directed. Please refer to the Current Medication list given to you today.  Labwork: None ordered.  Testing/Procedures: None ordered.  Follow-Up: Your physician wants you to follow-up in: one year with Dr. Ladona Ridgelaylor.  You will receive a reminder letter in the mail two months in advance. If you don't receive a letter, please call our office to schedule the follow-up appointment.  You will follow up with device clinic for a device check in 6 months.    Any Other Special Instructions Will Be Listed Below (If Applicable).   If you need a refill on your cardiac medications before your next appointment, please call your pharmacy.

## 2016-10-10 NOTE — Progress Notes (Signed)
HPI [name] returns today for follow-up of her complete heart block status post pacemaker insertion. 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 In the interim, she has been stable with no chest pain, shortness of breath, or syncope. She describes 1 episode of feeling lightheaded which occurred approximately 2 weeks ago. It lasted for 20 seconds. She did not lose consciousness. Allergies  Allergen Reactions  . Azithromycin Rash  . Doxycycline Rash  . Erythromycin Nausea And Vomiting  . Penicillins Rash  . Sulfamethoxazole-Trimethoprim Rash     Current Outpatient Prescriptions  Medication Sig Dispense Refill  . calcium carbonate (TUMS - DOSED IN MG ELEMENTAL CALCIUM) 500 MG chewable tablet Chew 1 tablet by mouth as needed (Take as directed).     . cephALEXin (KEFLEX) 500 MG capsule as directed. As needed dental work     . diphenhydrAMINE (BENADRYL) 25 mg capsule Take 25 mg by mouth every 6 (six) hours as needed for itching or allergies.    . diphenhydrAMINE (SOMINEX) 25 MG tablet Take 25 mg by mouth at bedtime as needed for sleep.    . furosemide (LASIX) 20 MG tablet Take 20 mg by mouth daily.    . hydrocortisone 1 % lotion Apply 1 application topically 2 (two) times daily. 118 mL 0  . ibuprofen (ADVIL,MOTRIN) 200 MG tablet Take 200 mg by mouth every 6 (six) hours as needed for pain.    Marland Kitchen. levothyroxine (SYNTHROID, LEVOTHROID) 88 MCG tablet Take 88 mcg by mouth at bedtime.     . metoprolol (LOPRESSOR) 100 MG tablet Take 50 mg by mouth daily.       No current facility-administered medications for this visit.      Past Medical History:  Diagnosis Date  . Asthma   . Congenital complete AV block 1978  . Hypertension   . Hypothyroidism   . Presence of permanent cardiac pacemaker   . PVC (premature ventricular contraction)     ROS:   All  systems reviewed and negative except as noted in the HPI.   Past Surgical History:  Procedure Laterality Date  . INSERT / REPLACE / REMOVE PACEMAKER     s/p PPM implant 1978 for congenital CHB   . PERMANENT PACEMAKER GENERATOR CHANGE N/A 04/25/2012   Procedure: PERMANENT PACEMAKER GENERATOR CHANGE;  Surgeon: Marinus MawGregg W Giovani Neumeister, MD;  [facility];  Service: Cardiovascular;  Laterality: N/A;     No family history on file.   Social History   Social History  . Marital status: Married    Spouse name: N/A  . Number of children: N/A  . Years of education: N/A   Occupational History  . Not on file.   Social History Main Topics  . Smoking status: Never Smoker  . Smokeless tobacco: Never Used  . Alcohol use No  . Drug use: No  . Sexual activity: Not on file   Other Topics Concern  . Not on file   Social History Narrative  . No narrative on file     BP 136/82   Pulse 70   Ht 5\' 3"  (1.6 m)   Wt 202 lb 6.4 oz (91.8 kg)   LMP 11/22/2011   SpO2 99%   BMI 35.85 kg/m   Physical Exam:  Well appearing Middle-aged woman, NAD HEENT: Unremarkable Neck:  6 cm JVD, no thyromegally Lymphatics:  No adenopathy Back:  No CVA tenderness Lungs:  Clear, with no wheezes, rales, or rhonchi; well-healed pacemaker incision in the left upper quadrant HEART:  Regular rate rhythm, no murmurs, no rubs, no clicks Abd:  soft, positive bowel sounds, no organomegally, no rebound, no guarding Ext:  2 plus pulses, no edema, no cyanosis, no clubbing Skin:  No rashes no nodules Neuro:  CN II through XII intact, motor grossly intact   DEVICE  Normal device function.  See PaceArt for details.   Assess/Plan: 1. Complete heart block - she is doing well status post pacemaker insertion. 2. Dual chamber pacemaker her St. Jude dual-chamber pacemaker is working normally. She has epicardial leads, with a lower than usual pacing impedance. Her weeds or working normally but her battery longevity will  be reduce some with the lower impedance lead. 3. Hypertension - she will continue her beta blocker. She is encouraged to maintain a low-sodium diet.  [name], M.D.

## 2017-04-04 ENCOUNTER — Ambulatory Visit: Payer: Managed Care, Other (non HMO) | Admitting: *Deleted

## 2017-04-04 DIAGNOSIS — Q246 Congenital heart block: Secondary | ICD-10-CM

## 2017-04-04 LAB — CUP PACEART INCLINIC DEVICE CHECK
Implantable Lead Implant Date: 20070629
Implantable Lead Location: 753859
Implantable Lead Location: 753860
Implantable Lead Model: 511212
Implantable Pulse Generator Implant Date: 20140314
Lead Channel Pacing Threshold Amplitude: 1.25 V
Lead Channel Sensing Intrinsic Amplitude: 0.6 mV
Lead Channel Setting Pacing Amplitude: 2 V
Lead Channel Setting Pacing Pulse Width: 0.5 ms
MDC IDC LEAD IMPLANT DT: 20070629
MDC IDC LEAD SERIAL: 109900
MDC IDC LEAD SERIAL: 109901
MDC IDC MSMT BATTERY IMPEDANCE: 1300 Ohm
MDC IDC MSMT BATTERY VOLTAGE: 2.75 V
MDC IDC MSMT LEADCHNL RA IMPEDANCE VALUE: 315 Ohm
MDC IDC MSMT LEADCHNL RA PACING THRESHOLD AMPLITUDE: 0.75 V
MDC IDC MSMT LEADCHNL RA PACING THRESHOLD PULSEWIDTH: 0.4 ms
MDC IDC MSMT LEADCHNL RV IMPEDANCE VALUE: 369 Ohm
MDC IDC MSMT LEADCHNL RV PACING THRESHOLD PULSEWIDTH: 0.5 ms
MDC IDC PG SERIAL: 7424521
MDC IDC SESS DTM: 20190221093929
MDC IDC SET LEADCHNL RV PACING AMPLITUDE: 2.5 V
MDC IDC SET LEADCHNL RV SENSING SENSITIVITY: 6 mV
Pulse Gen Model: 5826

## 2017-04-04 NOTE — Progress Notes (Signed)
Pacemaker check in clinic. Normal device function. Thresholds, sensing, impedances consistent with previous measurements. Device programmed to maximize longevity. No mode switch or high ventricular rates noted. Device programmed at appropriate safety margins. Histogram distribution appropriate for patient activity level. Device programmed to optimize intrinsic conduction. Estimated longevity 4-5 years. Patient education completed. ROV with GT in 6 months.

## 2017-08-06 ENCOUNTER — Telehealth: Payer: Self-pay | Admitting: Internal Medicine

## 2017-08-06 NOTE — Telephone Encounter (Signed)
New Message:       *STAT* If patient is at the pharmacy, call can be transferred to refill team.   1. Which medications need to be refilled? (please list name of each medication and dose if known) cephALEXin (KEFLEX) 500 MG capsule  2. Which pharmacy/location (including street and city if local pharmacy) is medication to be sent to?[facility] [place] [id number]  3. Do they need a 30 day or 90 day supply? 2 taken 1 hr prior to dentist visit

## 2017-08-07 ENCOUNTER — Telehealth: Payer: Self-pay | Admitting: Internal Medicine

## 2017-08-07 MED ORDER — CEPHALEXIN 500 MG PO CAPS: ORAL_CAPSULE | ORAL | 0 refills | Status: DC

## 2017-08-07 NOTE — Telephone Encounter (Signed)
Spoke with Dr. Ladona Ridgelaylor.  Ok to reorder Pt standing order for antibiotics prior to dental procedure.  Order entered.

## 2017-08-07 NOTE — Telephone Encounter (Signed)
Spoke with [name] at [facility] to make her aware that the pt's standing for Abx has been reordered.

## 2017-08-07 NOTE — Telephone Encounter (Signed)
New Message   Pt is having a exc and bone graft but wants to know if the pt needs to take pre med trying to save tooth with crown and build up  1. What dental office are you calling from? [facility] in [place]   2. What is your office phone number? [phone]  3. What is your fax number? [phone]  4. What type of procedure is the patient having performed? exc and bone graft but trying to save tooth with crown and build up  5. What date is procedure scheduled or is the patient there now? Tomorrow 08/08/17 at 8:00 am   6. What is your question (ex. Antibiotics prior to procedure, holding medication-we need to know how long dentist wants pt to hold med)? Want to know if the pt still needs his pre med prior to dentist procedure

## 2017-08-07 NOTE — Telephone Encounter (Signed)
Thanks for taking care of this, Boneta LucksJenny! I'll remove it from our preop box. Will cc to callback staff so they can call dental office to let them know. Dayna Dunn PA-C

## 2017-08-07 NOTE — Telephone Encounter (Signed)
See email thread.

## 2017-09-04 ENCOUNTER — Other Ambulatory Visit: Payer: Self-pay | Admitting: Internal Medicine

## 2017-09-05 ENCOUNTER — Encounter: Payer: Self-pay | Admitting: Internal Medicine

## 2017-09-05 MED ORDER — CEPHALEXIN 500 MG PO CAPS: ORAL_CAPSULE | ORAL | 0 refills | Status: DC

## 2017-10-30 ENCOUNTER — Ambulatory Visit: Payer: Managed Care, Other (non HMO) | Admitting: Internal Medicine

## 2017-10-30 ENCOUNTER — Encounter: Payer: Self-pay | Admitting: Internal Medicine

## 2017-10-30 VITALS — BP 104/60 | HR 70 | Ht 63.0 in | Wt 202.2 lb

## 2017-10-30 DIAGNOSIS — I10 Essential (primary) hypertension: Secondary | ICD-10-CM

## 2017-10-30 DIAGNOSIS — I442 Atrioventricular block, complete: Secondary | ICD-10-CM

## 2017-10-30 DIAGNOSIS — Z950 Presence of cardiac pacemaker: Secondary | ICD-10-CM

## 2017-10-30 MED ORDER — CEPHALEXIN 500 MG PO CAPS: ORAL_CAPSULE | ORAL | 12 refills | Status: DC

## 2017-10-30 NOTE — Patient Instructions (Addendum)
Medication Instructions:  Your physician recommends that you continue on your current medications as directed. Please refer to the Current Medication list given to you today.  Labwork: None ordered.  Testing/Procedures: None ordered.  Follow-Up: Your physician wants you to follow-up in: 6 months with the device clinic for a device check.  Your physician wants you to follow-up in: one year with Dr. Taylor.   You will receive a reminder letter in the mail two months in advance. If you don't receive a letter, please call our office to schedule the follow-up appointment.   Any Other Special Instructions Will Be Listed Below (If Applicable).  If you need a refill on your cardiac medications before your next appointment, please call your pharmacy.   

## 2017-10-30 NOTE — Progress Notes (Signed)
HPI [name] returns today for followup. She is a very pleasant 57 year old woman with a history of congenital complete heart block, status post permanent pacemaker insertion. She has an epicardial system. In the interim, she has done well. She denies chest pain, shortness of breath, or syncope. Minimal peripheral edema. This is associated with prolonged upright activity. She is working full time. Allergies  Allergen Reactions  . Azithromycin Rash  . Doxycycline Rash  . Erythromycin Nausea And Vomiting  . Penicillins Rash  . Sulfamethoxazole-Trimethoprim Rash     Current Outpatient Medications  Medication Sig Dispense Refill  . calcium carbonate (TUMS - DOSED IN MG ELEMENTAL CALCIUM) 500 MG chewable tablet Chew 1 tablet by mouth as needed (Take as directed).     . cephALEXin (KEFLEX) 500 MG capsule Take 4 tablets one hour prior to dental work 4 capsule 12  . diphenhydrAMINE (BENADRYL) 25 mg capsule Take 25 mg by mouth every 6 (six) hours as needed for itching or allergies.    . diphenhydrAMINE (SOMINEX) 25 MG tablet Take 25 mg by mouth at bedtime as needed for sleep.    . furosemide (LASIX) 20 MG tablet Take 20 mg by mouth daily.    . hydrocortisone 1 % lotion Apply 1 application topically 2 (two) times daily. 118 mL 0  . ibuprofen (ADVIL,MOTRIN) 200 MG tablet Take 200 mg by mouth every 6 (six) hours as needed for pain.    Marland Kitchen levothyroxine (SYNTHROID, LEVOTHROID) 88 MCG tablet Take 88 mcg by mouth at bedtime.     Marland Kitchen lisinopril (PRINIVIL,ZESTRIL) 40 MG tablet Take 40 mg by mouth daily.    . metoprolol (LOPRESSOR) 100 MG tablet Take 50 mg by mouth daily.      . pregabalin (LYRICA) 50 MG capsule Take 50 mg by mouth 3 (three) times daily.    Marland Kitchen trolamine salicylate (ASPERCREME) 10 % cream Apply 1 application topically as needed for muscle pain.     No current facility-administered medications for this visit.      Past Medical History:  Diagnosis Date  . Asthma   . Congenital  complete AV block 1978  . Hypertension   . Hypothyroidism   . Presence of permanent cardiac pacemaker   . PVC (premature ventricular contraction)     ROS:   All systems reviewed and negative except as noted in the HPI.   Past Surgical History:  Procedure Laterality Date  . INSERT / REPLACE / REMOVE PACEMAKER     s/p PPM implant 1978 for congenital CHB   . PERMANENT PACEMAKER GENERATOR CHANGE N/A 04/25/2012   Procedure: PERMANENT PACEMAKER GENERATOR CHANGE;  Surgeon: Marinus Maw, MD;  Location: [facility] CATH LAB;  Service: Cardiovascular;  Laterality: N/A;     History reviewed. No pertinent family history.   Social History   Socioeconomic History  . Marital status: Married    Spouse name: Not on file  . Number of children: Not on file  . Years of education: Not on file  . Highest education level: Not on file  Occupational History  . Not on file  Social Needs  . Financial resource strain: Not on file  . Food insecurity:    Worry: Not on file    Inability: Not on file  . Transportation needs:    Medical: Not on file    Non-medical: Not on file  Tobacco Use  . Smoking status: Never Smoker  . Smokeless tobacco: Never Used  Substance and Sexual Activity  .  Alcohol use: No  . Drug use: No  . Sexual activity: Not on file  Lifestyle  . Physical activity:    Days per week: Not on file    Minutes per session: Not on file  . Stress: Not on file  Relationships  . Social connections:    Talks on phone: Not on file    Gets together: Not on file    Attends religious service: Not on file    Active member of club or organization: Not on file    Attends meetings of clubs or organizations: Not on file    Relationship status: Not on file  . Intimate partner violence:    Fear of current or ex partner: Not on file    Emotionally abused: Not on file    Physically abused: Not on file    Forced sexual activity: Not on file  Other Topics Concern  . Not on file  Social History  Narrative  . Not on file     BP 104/60   Pulse 70   Ht 5\' 3"  (1.6 m)   Wt 202 lb 3.2 oz (91.7 kg)   LMP 11/22/2011   SpO2 97%   BMI 35.82 kg/m   Physical Exam:  Well appearing middle aged woman, NAD HEENT: Unremarkable Neck:  6 cm JVD, no thyromegally Lymphatics:  No adenopathy Back:  No CVA tenderness Lungs:  Clear with no wheezes HEART:  Regular rate rhythm, no murmurs, no rubs, no clicks Abd:  soft, positive bowel sounds, no organomegally, no rebound, no guarding Ext:  2 plus pulses, no edema, no cyanosis, no clubbing Skin:  No rashes no nodules Neuro:  CN II through XII intact, motor grossly intact  EKG - nsr with ventricular pacing  DEVICE  Normal device function.  See PaceArt for details.   Assess/Plan: 1. Complete heart block - she is stable s/p PPM insertion. 2. PPM - her device is working normally. We will recheck in several months. 3. Obesity - we have encouraged the patient to lose weight, reduce her sodium intake and increase her activity.  Leonia ReevesGregg Taylor,M.D.

## 2017-11-04 ENCOUNTER — Other Ambulatory Visit: Payer: Self-pay | Admitting: Internal Medicine

## 2017-11-04 NOTE — Telephone Encounter (Signed)
Pt's pharmacy is requesting a refill on cephalexin for dental work. Would Dr. Ladona Ridgelaylor like to refill this medication? Please address

## 2018-04-29 ENCOUNTER — Telehealth: Payer: Self-pay | Admitting: *Deleted

## 2018-04-29 NOTE — Telephone Encounter (Signed)
Spoke with patient to reschedule 05/01/18 appointment for a few weeks out. Pt agreeable, plan for PPM check in [place] on 05/29/18. Pt requests BP check at this appointment as she has been having some trouble with low BPs. She doesn't have a way to check it at home, but reports that when she goes to MD appointments it has been running on the lower end of normal. Pt reports some dizziness with position changes, improved since cutting back on lisinopril to 20mg  (0.5 tab) daily. Encouraged patient to f/u with PCP for further recommendations as her PCP has been prescribing lisinopril. She verbalizes understanding and thanked me for my call.

## 2018-05-01 ENCOUNTER — Encounter: Payer: Managed Care, Other (non HMO) | Admitting: Physician Assistant

## 2018-06-02 ENCOUNTER — Ambulatory Visit: Payer: Managed Care, Other (non HMO) | Admitting: *Deleted

## 2018-06-02 ENCOUNTER — Other Ambulatory Visit: Payer: Self-pay

## 2018-06-02 VITALS — BP 98/56 | HR 75

## 2018-06-02 DIAGNOSIS — Z950 Presence of cardiac pacemaker: Secondary | ICD-10-CM

## 2018-06-02 DIAGNOSIS — I442 Atrioventricular block, complete: Secondary | ICD-10-CM | POA: Diagnosis not present

## 2018-06-02 LAB — CUP PACEART INCLINIC DEVICE CHECK
Battery Impedance: 2000 Ohm
Battery Voltage: 2.76 V
Brady Statistic RA Percent Paced: 82 %
Brady Statistic RV Percent Paced: 99 %
Date Time Interrogation Session: 20200420163235
Implantable Lead Implant Date: 20070629
Implantable Lead Implant Date: 20070629
Implantable Lead Location: 753859
Implantable Lead Location: 753860
Implantable Lead Model: 511212
Implantable Lead Model: 511212
Implantable Lead Serial Number: 109900
Implantable Lead Serial Number: 109901
Implantable Pulse Generator Implant Date: 20140314
Lead Channel Impedance Value: 335 Ohm
Lead Channel Impedance Value: 335 Ohm
Lead Channel Impedance Value: 335 Ohm
Lead Channel Impedance Value: 423 Ohm
Lead Channel Impedance Value: 423 Ohm
Lead Channel Impedance Value: 423 Ohm
Lead Channel Pacing Threshold Amplitude: 0.75 V
Lead Channel Pacing Threshold Amplitude: 0.75 V
Lead Channel Pacing Threshold Amplitude: 0.75 V
Lead Channel Pacing Threshold Amplitude: 1 V
Lead Channel Pacing Threshold Amplitude: 1 V
Lead Channel Pacing Threshold Amplitude: 1 V
Lead Channel Pacing Threshold Pulse Width: 0.4 ms
Lead Channel Pacing Threshold Pulse Width: 0.4 ms
Lead Channel Pacing Threshold Pulse Width: 0.4 ms
Lead Channel Pacing Threshold Pulse Width: 0.5 ms
Lead Channel Pacing Threshold Pulse Width: 0.5 ms
Lead Channel Pacing Threshold Pulse Width: 0.5 ms
Lead Channel Sensing Intrinsic Amplitude: 0.3 mV
Lead Channel Setting Pacing Amplitude: 2 V
Lead Channel Setting Pacing Amplitude: 2.5 V
Lead Channel Setting Pacing Pulse Width: 0.5 ms
Lead Channel Setting Sensing Sensitivity: 6 mV
Pulse Gen Model: 5826
Pulse Gen Serial Number: 7424521

## 2018-06-02 NOTE — Progress Notes (Signed)
Pacemaker check in clinic. Normal device function. Thresholds, sensing, impedances consistent with previous measurements. Device programmed to maximize longevity. Mode switch 19%, 1 episode lasting 40 days in 11/2017,No [facility], will review with GT. Device programmed at appropriate safety margins. Histogram distribution appropriate for patient activity level. Device programmed to optimize intrinsic conduction. Estimated longevity 3-4 years. Plan to follow up with Dr [name] In 6 months.  Patient requested BP check which was done. BP 98/56 and pt asymptomatic at office visit. Pt feels Lisinipril dosage is too high and requested recommendation on adjusting med dosage. Lisinipril ordered by PCP and pt instructed to f/u with PCP for recommendations on dosage.

## 2018-06-03 ENCOUNTER — Telehealth: Payer: Self-pay

## 2018-06-03 NOTE — Telephone Encounter (Signed)
-----   Message from Uvaldo Rising sent at 06/02/2018  5:32 PM EDT ----- 40 days of suspected Afib in 11/2017 (no EGM). No OAC, please advise if  Any recommendations.

## 2018-06-03 NOTE — Telephone Encounter (Signed)
Major interaction is bleeding risk. NSAIDs (meloxicam and ibuprofen) increase bleeding risk. Recommend using lowest effective dose for shortest duration possible of NSAID and monitor for signs/symptoms of bleeding.

## 2018-06-03 NOTE — Telephone Encounter (Signed)
Per [facility], Eliquis is listed as a "major" interaction with meloxicam (patient takes 15mg  once daily PRN due to back pain). PRN ibuprofen is also on med list. Please advise if safe for patient to continue taking these medications if she starts Eliquis.  Will plan to contact patient tomorrow with Dr. Lubertha Basque recommendations to start Eliquis.

## 2018-06-03 NOTE — Telephone Encounter (Signed)
Per Dr. [name]-  Have Pt start Eliquis 5 mg one tablet by mouth BID.  She can be scheduled for a virtual visit if she has any questions.

## 2018-06-05 NOTE — Telephone Encounter (Signed)
Spoke with patient. Discussed Dr. Lubertha Basque recommendations to start Eliquis 5mg  BID. Explained what A-fib is and how it relates to stroke risk. Pt asked if she could just take aspirin--explained it is not proven to be effective at reducing stroke risk for A-fib. Advised if Eliquis is too expensive with her insurance, that she should let us know. Offered to send prescription to the pharmacy and setup virtual visit with Dr. [name], but patient states "I'm just not sure that I want to do that." She is agreeable to a virtual visit, but wishes to wait on the actual prescription. She has access to her work laptop and would be able to do a video visit. Advised I will send this message to Boneta Lucks, RN, to setup an appointment for her. Pt is agreeable to this plan and thanked me for my call.

## 2018-06-05 NOTE — Telephone Encounter (Signed)
Lets set her up for a virtual visit in next week. GT

## 2018-06-06 ENCOUNTER — Telehealth: Payer: Self-pay | Admitting: Internal Medicine

## 2018-06-06 NOTE — Telephone Encounter (Signed)
New message    Spoke with pt about setting up virtual visit with Dr. [name]. Pt scheduled for May 1st at 145. Pt states her cell phone would get text and she could type in the link into her tablet. Told pt not sure if that would work. If it wouldn't Dr. [name] would do a phone call with her.      Virtual Visit Pre-Appointment Phone Call  "(Name), I am calling you today to discuss your upcoming appointment. We are currently trying to limit exposure to the virus that causes COVID-19 by seeing patients at home rather than in the office."  1. "What is the BEST phone number to call the day of the visit?" - include this in appointment notes  2. Do you have or have access to (through a family member/friend) a smartphone with video capability that we can use for your visit?" a. If yes - list this number in appt notes as cell (if different from BEST phone #) and list the appointment type as a VIDEO visit in appointment notes b. If no - list the appointment type as a PHONE visit in appointment notes  3. Confirm consent - "In the setting of the current Covid19 crisis, you are scheduled for a (phone or video) visit with your provider on (date) at (time).  Just as we do with many in-office visits, in order for you to participate in this visit, we must obtain consent.  If you'd like, I can send this to your mychart (if signed up) or email for you to review.  Otherwise, I can obtain your verbal consent now.  All virtual visits are billed to your insurance company just like a normal visit would be.  By agreeing to a virtual visit, we'd like you to understand that the technology does not allow for your provider to perform an examination, and thus may limit your provider's ability to fully assess your condition. If your provider identifies any concerns that need to be evaluated in person, we will make arrangements to do so.  Finally, though the technology is pretty good, we cannot assure that it will always work  on either your or our end, and in the setting of a video visit, we may have to convert it to a phone-only visit.  In either situation, we cannot ensure that we have a secure connection.  Are you willing to proceed?" STAFF: Did the patient verbally acknowledge consent to telehealth visit? Document YES/NO here: YES  4. Advise patient to be prepared - "Two hours prior to your appointment, go ahead and check your blood pressure, pulse, oxygen saturation, and your weight (if you have the equipment to check those) and write them all down. When your visit starts, your provider will ask you for this information. If you have an Apple Watch or Kardia device, please plan to have heart rate information ready on the day of your appointment. Please have a pen and paper handy nearby the day of the visit as well."  5. Give patient instructions for MyChart download to smartphone OR Doximity/Doxy.me as below if video visit (depending on what platform provider is using)  6. Inform patient they will receive a phone call 15 minutes prior to their appointment time (may be from unknown caller ID) so they should be prepared to answer    TELEPHONE CALL NOTE  Juliann Pulse Liskey has been deemed a candidate for a follow-up tele-health visit to limit community exposure during the Covid-19 pandemic. I spoke with  the patient via phone to ensure availability of phone/video source, confirm preferred email & phone number, and discuss instructions and expectations.  I reminded Theresa Dutyelonda L Kotlyar to be prepared with any vital sign and/or heart rhythm information that could potentially be obtained via home monitoring, at the time of her visit. I reminded Theresa Dutyelonda L Acevedo to expect a phone call prior to her visit.  Ashland Toniann Fail Edwards 06/06/2018 10:34 AM   INSTRUCTIONS FOR DOWNLOADING THE MYCHART APP TO SMARTPHONE  - The patient must first make sure to have activated MyChart and know their login information - If Apple, go to [employer] Store and  type in MyChart in the search bar and download the app. If Android, ask patient to go to [employer] Play Store and type in CanoncitoMyChart in the search bar and download the app. The app is free but as with any other app downloads, their phone may require them to verify saved payment information or Apple/Android password.  - The patient will need to then log into the app with their MyChart username and password, and select [facility] as their healthcare provider to link the account. When it is time for your visit, go to the MyChart app, find appointments, and click Begin Video Visit. Be sure to Select Allow for your device to access the Microphone and Camera for your visit. You will then be connected, and your provider will be with you shortly.  **If they have any issues connecting, or need assistance please contact MyChart service desk [phone])**  **If using a computer, in order to ensure the best quality for their visit they will need to use either of the following Internet Browsers: [employer] Edge, or Google Chrome**  IF USING DOXIMITY or DOXY.ME - The patient will receive a link just prior to their visit by text.     FULL LENGTH CONSENT FOR TELE-HEALTH VISIT   I hereby voluntarily request, consent and authorize [facility] and its employed or contracted physicians, physician assistants, nurse practitioners or other licensed health care professionals (the Practitioner), to provide me with telemedicine health care services (the Services") as deemed necessary by the treating Practitioner. I acknowledge and consent to receive the Services by the Practitioner via telemedicine. I understand that the telemedicine visit will involve communicating with the Practitioner through live audiovisual communication technology and the disclosure of certain medical information by electronic transmission. I acknowledge that I have been given the opportunity to request an in-person assessment or other  available alternative prior to the telemedicine visit and am voluntarily participating in the telemedicine visit.  I understand that I have the right to withhold or withdraw my consent to the use of telemedicine in the course of my care at any time, without affecting my right to future care or treatment, and that the Practitioner or I may terminate the telemedicine visit at any time. I understand that I have the right to inspect all information obtained and/or recorded in the course of the telemedicine visit and may receive copies of available information for a reasonable fee.  I understand that some of the potential risks of receiving the Services via telemedicine include:   Delay or interruption in medical evaluation due to technological equipment failure or disruption;  Information transmitted may not be sufficient (e.g. poor resolution of images) to allow for appropriate medical decision making by the Practitioner; and/or   In rare instances, security protocols could fail, causing a breach of personal health information.  Furthermore, I acknowledge that it  is my responsibility to provide information about my medical history, conditions and care that is complete and accurate to the best of my ability. I acknowledge that Practitioner's advice, recommendations, and/or decision may be based on factors not within their control, such as incomplete or inaccurate data provided by me or distortions of diagnostic images or specimens that may result from electronic transmissions. I understand that the practice of medicine is not an exact science and that Practitioner makes no warranties or guarantees regarding treatment outcomes. I acknowledge that I will receive a copy of this consent concurrently upon execution via email to the email address I last provided but may also request a printed copy by calling the office of [facility].    I understand that my insurance will be billed for this visit.   I have  read or had this consent read to me.  I understand the contents of this consent, which adequately explains the benefits and risks of the Services being provided via telemedicine.   I have been provided ample opportunity to ask questions regarding this consent and the Services and have had my questions answered to my satisfaction.  I give my informed consent for the services to be provided through the use of telemedicine in my medical care  By participating in this telemedicine visit I agree to the above.

## 2018-06-13 ENCOUNTER — Telehealth (INDEPENDENT_AMBULATORY_CARE_PROVIDER_SITE_OTHER): Payer: Managed Care, Other (non HMO) | Admitting: Internal Medicine

## 2018-06-13 ENCOUNTER — Other Ambulatory Visit: Payer: Self-pay

## 2018-06-13 DIAGNOSIS — I10 Essential (primary) hypertension: Secondary | ICD-10-CM | POA: Diagnosis not present

## 2018-06-13 DIAGNOSIS — I442 Atrioventricular block, complete: Secondary | ICD-10-CM | POA: Diagnosis not present

## 2018-06-13 DIAGNOSIS — Z950 Presence of cardiac pacemaker: Secondary | ICD-10-CM | POA: Diagnosis not present

## 2018-06-13 NOTE — Progress Notes (Signed)
Electrophysiology TeleHealth Note   Due to national recommendations of social distancing due to COVID 19, an audio/video telehealth visit is felt to be most appropriate for this patient at this time.  See MyChart message from today for the patient's consent to telehealth for [facility].   Date:  06/13/2018   ID:  [name], DOB 1960/07/04, MRN [id number]  Location: patient's home  Provider location: [address], [place]  Evaluation Performed: Follow-up visit  PCP:  Thayer Headings, MD (Inactive)  Cardiologist:  No primary care provider on file.  Electrophysiologist:  Dr [name]  Chief Complaint:  "I am still mean as ever"  History of Present Illness:    [name] is a 58 y.o. female who presents via audio/video conferencing for a telehealth visit today. She has a h/o congenital CHB, s/p PPM insertion. She was found to have persistent atrial fib for which she was asymptomatic. She notes that the atrial fib coincided with increased stress at work. Since last being seen in our clinic, the patient reports doing very well.  Today, she denies symptoms of palpitations, chest pain, shortness of breath,  lower extremity edema, dizziness, presyncope, or syncope.  The patient is otherwise without complaint today.  The patient denies symptoms of fevers, chills, cough, or new SOB worrisome for COVID 19.  Past Medical History:  Diagnosis Date  . Asthma   . Congenital complete AV block 1978  . Hypertension   . Hypothyroidism   . Presence of permanent cardiac pacemaker   . PVC (premature ventricular contraction)     Past Surgical History:  Procedure Laterality Date  . INSERT / REPLACE / REMOVE PACEMAKER     s/p PPM implant 1978 for congenital CHB   . PERMANENT PACEMAKER GENERATOR CHANGE N/A 04/25/2012   Procedure: PERMANENT PACEMAKER GENERATOR CHANGE;  Surgeon: Marinus Maw, MD;  Location: [facility] CATH LAB;  Service: Cardiovascular;  Laterality: N/A;     Current Outpatient Medications  Medication Sig Dispense Refill  . calcium carbonate (TUMS - DOSED IN MG ELEMENTAL CALCIUM) 500 MG chewable tablet Chew 1 tablet by mouth as needed (Take as directed).     . cephALEXin (KEFLEX) 500 MG capsule Take 4 tablets one hour prior to dental work 4 capsule 12  . cyclobenzaprine (FLEXERIL) 10 MG tablet Take 1 tablet by mouth 3 (three) times daily as needed for spasms.    . diphenhydrAMINE (BENADRYL) 25 mg capsule Take 25 mg by mouth every 6 (six) hours as needed for itching or allergies.    . diphenhydrAMINE (SOMINEX) 25 MG tablet Take 25 mg by mouth at bedtime as needed for sleep.    . furosemide (LASIX) 20 MG tablet Take 20 mg by mouth daily.    . hydrocortisone 1 % lotion Apply 1 application topically 2 (two) times daily. 118 mL 0  . ibuprofen (ADVIL,MOTRIN) 200 MG tablet Take 200 mg by mouth every 6 (six) hours as needed for pain.    Marland Kitchen levothyroxine (SYNTHROID, LEVOTHROID) 88 MCG tablet Take 88 mcg by mouth at bedtime.     Marland Kitchen lisinopril (PRINIVIL,ZESTRIL) 40 MG tablet Take 20 mg by mouth daily.     . meloxicam (MOBIC) 15 MG tablet Take 1 tablet by mouth daily as needed for pain.    . metoprolol (LOPRESSOR) 100 MG tablet Take 50 mg by mouth daily.      . pregabalin (LYRICA) 50 MG capsule Take 50 mg by mouth 3 (three) times daily.    Marland Kitchen  trolamine salicylate (ASPERCREME) 10 % cream Apply 1 application topically as needed for muscle pain.     No current facility-administered medications for this visit.     Allergies:   Azithromycin; Doxycycline; Erythromycin; Penicillins; and Sulfamethoxazole-trimethoprim   Social History:  The patient  reports that she has never smoked. She has never used smokeless tobacco. She reports that she does not drink alcohol or use drugs.   Family History:  The patient's  family history is not on file.   ROS:  Please see the history of present illness.   All other systems are personally reviewed and negative.    Exam:     Vital Signs:  LMP 11/22/2011   Well appearing, alert and conversant, regular work of breathing,  good skin color Eyes- anicteric, neuro- grossly intact, skin- no apparent rash or lesions or cyanosis, mouth- oral mucosa is pink   Labs/Other Tests and Data Reviewed:    Recent Labs: No results found for requested labs within last 8760 hours.   Wt Readings from Last 3 Encounters:  10/30/17 202 lb 3.2 oz (91.7 kg)  10/10/16 202 lb 6.4 oz (91.8 kg)  07/13/16 201 lb (91.2 kg)     Other studies personally reviewed:  Last device remote is reviewed from PaceART PDF dated 4/20 which reveals normal device function, no arrhythmias except for the atrial fib lasting 30 days   ASSESSMENT & PLAN:    1.  PAF - I have discussed the treatment options. She is CHADSVASC 2. I have recommended systemic anti-coagulation. She is not interested in pursuing this. I explained that without the anti-coagulation, she is at risk for stroke and I have reminded her that ASA will not protect her from a stroke. 2. CHB - she is asymptomatic, s/p PPM insertion. 3. PPM - her [employer] Jude device is working normally. 4. COVID 19 screen The patient denies symptoms of COVID 19 at this time.  The importance of social distancing was discussed today.  Follow-up:  12 months Next remote: 7/20  Current medicines are reviewed at length with the patient today.   The patient does not have concerns regarding her medicines.  The following changes were made today:  none  Labs/ tests ordered today include:  No orders of the defined types were placed in this encounter.    Patient Risk:  after full review of this patients clinical status, I feel that they are at moderate risk at this time.  Today, I have spent 25 minutes with the patient with telehealth technology discussing all of the above.    Signed, Lewayne BuntingGregg Taylor, MD  06/13/2018 2:44 PM     [facility] [address] [place] [address] [phone]  (office) [phone] (fax)

## 2019-01-07 ENCOUNTER — Other Ambulatory Visit: Payer: Self-pay | Admitting: Internal Medicine

## 2019-01-07 NOTE — Telephone Encounter (Signed)
Pt's pharmacy is requesting a refill on cephalexin for a dental procedure. Would Dr. Lovena Le like to refill this medication? Please address

## 2019-07-08 NOTE — Progress Notes (Signed)
Cardiology Office Note Date:  07/09/2019  Patient ID:  [name], [name] Jan 22, 1961, MRN [id number] PCP:  Pearson Grippe, MD  EP:  Dr. [name]    Chief Complaint: annual visit  History of Present Illness: [name] is a 59 y.o. female with history of HTN, hypothyroidism, asthma, congential CHB, Afib.  April 2020 she was found with a marked increase in Afib burden, with 40 days of AF, Dr. [name] recommended via phone notes starting Eliquis, she was reluctant goven meloxicam, recommended reduction in dose and use of meloxicam and starting Eliquis, patient remained leary, planned for virtual visit   She saw Dr. [name] May 2020 via tele health, she had been found with AFib (she though triggered by stress at work), recommended a/c, though she declined.  Planned for annual visit.  She is doing well.  Describes a very sedentary life style.  She works at [employer] job, does not exercise.  When asked of busy with care of her home, she mentions that her husband does most of the chores.  Says "I am just lazy"  Though has been doing some gardening of late. At her current level of activities, she denies any exertional CP, mentioning that for many years, particularly since her last extraction procedure years ago she has a dull awareness of an ache in her chest that is somewhat constant.  No rest DOE, but does get winded, not new or escalating. No dizzy spells, near syncope or syncope.   Device information SJM dual chamber PPM, gen change 2014 (L abdominal implant) Her leads are epicardial from 2007   08/10/2005 OP report (Dr. Laneta Simmers) Median sternotomy, extracorporeal circulation, removal  of infected permanent pacemaker system via the right atrium.  Implantation  of new epicardial dual-chamber permanent pacing system  08/22/2001: OP report (Dr. Aleen Campi) her first pacemaker at age 17 at the [facility] [place]  This was a VVI pacemaker with a large pulse generator and  this pulse generator later migrated through her right breast and extruded underneath her right breast on the lower aspect of the pulse generator.  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 Orson Slick, general surgeon, and replaced it with a smaller unit, higher in her right chest.  She did well with that unit still being a VVI pacer from 1983 until 1990 when that pulse generator had battery failure.  Her third pacemaker then was implanted in 1990 at [facility] at which time we upgraded the system to a dual chamber pacemaker by inserting a new atrial lead into the right subclavian vein.  She states that we encountered marked difficulty in inserting the new atrial lead in 1990.  This was a unipolar lead and she continued to have a unipolar system.  She now returns 13 years later with battery depletion showing signs of expected replacement time of the pulse Generator... Marland Kitchen.. 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  She had good collaterals around this section.  We then discussed alternatives again with the patient and informed her that we were going to be unable to put in a new atrial lead in the right side and made a recommendation to put in a new bipolar system in the left chest using the left subclavian vein.  However, she refused to consider this option.  We felt that this was her best longterm alternative.  After she  refused this alternative, we offered to cap off the atrial channel and the new pulse generator which was on the table and continue pacing her in a VVI mode.  She felt that this was the least invasive of all alternatives and wanted [place] to proceed with this alternative. We then proceeded to cap off the atrial channel on the new pulse generator and continued to pace in a VVI mode  1st device was Right abdomen Extracted   Past Medical History:  Diagnosis Date  . Asthma   .  Congenital complete AV block 1978  . Hypertension   . Hypothyroidism   . Presence of permanent cardiac pacemaker   . PVC (premature ventricular contraction)     Past Surgical History:  Procedure Laterality Date  . INSERT / REPLACE / REMOVE PACEMAKER     s/p PPM implant 1978 for congenital CHB   . PERMANENT PACEMAKER GENERATOR CHANGE N/A 04/25/2012   Procedure: PERMANENT PACEMAKER GENERATOR CHANGE;  Surgeon: Marinus Maw, MD;  Location: [facility] CATH LAB;  Service: Cardiovascular;  Laterality: N/A;    Current Outpatient Medications  Medication Sig Dispense Refill  . aspirin EC 81 MG tablet Take 81 mg by mouth daily.    . calcium carbonate (TUMS - DOSED IN MG ELEMENTAL CALCIUM) 500 MG chewable tablet Chew 1 tablet by mouth as needed (Take as directed).     . cephALEXin (KEFLEX) 500 MG capsule TAKE 4 CAPSULES 1 HOUR PRIOR TO DENTAL APPOINTMENT 4 capsule 0  . cyclobenzaprine (FLEXERIL) 10 MG tablet Take 1 tablet by mouth 3 (three) times daily as needed for spasms.    . diphenhydrAMINE (BENADRYL) 25 mg capsule Take 25 mg by mouth every 6 (six) hours as needed for itching or allergies.    . diphenhydrAMINE (SOMINEX) 25 MG tablet Take 25 mg by mouth at bedtime as needed for sleep.    . furosemide (LASIX) 20 MG tablet Take 20 mg by mouth daily.    . hydrocortisone 1 % lotion Apply 1 application topically 2 (two) times daily. 118 mL 0  . ibuprofen (ADVIL,MOTRIN) 200 MG tablet Take 200 mg by mouth every 6 (six) hours as needed for pain.    Marland Kitchen levothyroxine (SYNTHROID, LEVOTHROID) 88 MCG tablet Take 88 mcg by mouth at bedtime.     . meloxicam (MOBIC) 15 MG tablet Take 1 tablet by mouth daily as needed for pain.    . metoprolol (LOPRESSOR) 100 MG tablet Take 50 mg by mouth daily.      . pregabalin (LYRICA) 25 MG capsule Take 25 mg by mouth 3 (three) times daily.    Marland Kitchen trolamine salicylate (ASPERCREME) 10 % cream Apply 1 application topically as needed for muscle pain.     No current  facility-administered medications for this visit.    Allergies:   Azithromycin, Doxycycline, Erythromycin, Penicillins, and Sulfamethoxazole-trimethoprim   Social History:  The patient  reports that she has never smoked. She has never used smokeless tobacco. She reports that she does not drink alcohol or use drugs.   Family History:  The patient's family history includes Breast cancer in her maternal aunt; COPD in her mother; Heart disease in her maternal grandfather and mother; Hypothyroidism in her father.  ROS:  Please see the history of present illness.    All other systems are reviewed and otherwise negative.   PHYSICAL EXAM:  VS:  BP 126/72   Pulse 70   Ht 5\' 3"  (1.6 m)   Wt 202 lb (91.6 kg)  LMP 11/22/2011   BMI 35.78 kg/m  BMI: Body mass index is 35.78 kg/m. Well nourished, well developed, in no acute distress  HEENT: normocephalic, atraumatic  Neck: no JVD, carotid bruits or masses Cardiac:  RRR; no significant murmurs, no rubs, or gallops Lungs: CTA b/l, no wheezing, rhonchi or rales  Abd: soft, nontender MS: no deformity or atrophy Ext: no edema  Skin: warm and dry, no rash Neuro:  No gross deficits appreciated Psych: euthymic mood, full affect  PPM site (left abdomen) is stable, no tethering or discomfort   EKG:  Done today and reviewed by myself shows  AV paced  PPM interrogation done today and reviewed by myself:  Battery estimate is 2-2.5 years Lead measurements are good No R waves at 40 No mode switches No programming changes made   07/02/2005: [facility] TEE SUMMARY  - Overall left ventricular systolic function was normal.  - There was no atheroma of the aortic arch.  - IAS: Aneurysmal with very small PFO. Mildly positive Right to     Left Shunting.  - There was the appearance of a catheter or pacing wire in the     right ventricle.  - Finnstrated mobile multiple masses, Small in size, very     suspicious for vegetations. The  TV moderate to severe TR.     Moderate pulmonary HTN.  - There was the appearance of a catheter or pacing wire in the     right atrium.  COMPARISONS  - Compared to the previous study of 21-Aug-2004 : There are small     mobile mass attached to the tricuspid valve leaflets on the     atrial side. These are very suspicious for vegetations. These     mobile masses were noted previously(1-2 only), but appear to     be more in number. The previously noted. The tricuspid     regurgitation appears to be moderately severe compared to     moderate previously. Clinical corelation highly recomended.     Recent Labs: No results found for requested labs within last 8760 hours.  No results found for requested labs within last 8760 hours.   CrCl cannot be calculated (Patient's most recent lab result is older than the maximum 21 days allowed.).   Wt Readings from Last 3 Encounters:  07/09/19 202 lb (91.6 kg)  10/30/17 202 lb 3.2 oz (91.7 kg)  10/10/16 202 lb 6.4 oz (91.8 kg)     Other studies reviewed: Additional studies/records reviewed today include: summarized above  ASSESSMENT AND PLAN:  1. PPM     Intact function      She is dependent   2. Afib     CHA2DS2Vasc is not on a/c (pt choice)     0% burden  3. HTN     She mentions off lisinopril for several months with SBP in the 90s   4. Very sedentary     She has some baseline DOE That is not new or changing and likely 2/2 deconditioned state, very inactive.      Discussed starting to walk for exercise, she agrees with the rational, though does not seem particularly motivated.    Disposition: F/u with device clinic in 86mo, MD/APP in a year   ADDEND: Have decided that we should update her echo given has been so many years and long standiong RV pacing I have messaged my MA to reach arrange/reach out to the patient.  Update labs if not done by PMD as  well.   Current medicines are reviewed  at length with the patient today.  The patient did not have any concerns regarding medicines.  Norma Fredrickson, PA-C 07/09/2019 5:49 PM     [facility] [address] [place] [address] [phone] (office)  [phone] (fax)

## 2019-07-09 ENCOUNTER — Other Ambulatory Visit: Payer: Self-pay

## 2019-07-09 ENCOUNTER — Ambulatory Visit: Payer: Managed Care, Other (non HMO) | Admitting: Physician Assistant

## 2019-07-09 ENCOUNTER — Encounter: Payer: Self-pay | Admitting: Physician Assistant

## 2019-07-09 VITALS — BP 126/72 | HR 70 | Ht 63.0 in | Wt 202.0 lb

## 2019-07-09 DIAGNOSIS — R0600 Dyspnea, unspecified: Secondary | ICD-10-CM

## 2019-07-09 DIAGNOSIS — R0609 Other forms of dyspnea: Secondary | ICD-10-CM

## 2019-07-09 DIAGNOSIS — I10 Essential (primary) hypertension: Secondary | ICD-10-CM

## 2019-07-09 DIAGNOSIS — I442 Atrioventricular block, complete: Secondary | ICD-10-CM

## 2019-07-09 DIAGNOSIS — Z950 Presence of cardiac pacemaker: Secondary | ICD-10-CM

## 2019-07-09 NOTE — Patient Instructions (Addendum)
Medication Instructions:  Your physician recommends that you continue on your current medications as directed. Please refer to the Current Medication list given to you today.'  *If you need a refill on your cardiac medications before your next appointment, please call your pharmacy*   Lab Work: NONE ORDERED  TODAY   If you have labs (blood work) drawn today and your tests are completely normal, you will receive your results only by: Marland Kitchen MyChart Message (if you have MyChart) OR . A paper copy in the mail If you have any lab test that is abnormal or we need to change your treatment, we will call you to review the results.   Testing/Procedures: NONE ORDERED  TODAY  Follow-Up: At [facility], you and your health needs are our priority.  As part of our continuing mission to provide you with exceptional heart care, we have created designated Provider Care Teams.  These Care Teams include your primary Cardiologist (physician) and Advanced Practice Providers (APPs -  Physician Assistants and Nurse Practitioners) who all work together to provide you with the care you need, when you need it.  We recommend signing up for the patient portal called "MyChart".  Sign up information is provided on this After Visit Summary.  MyChart is used to connect with patients for Virtual Visits (Telemedicine).  Patients are able to view lab/test results, encounter notes, upcoming appointments, etc.  Non-urgent messages can be sent to your provider as well.   To learn more about what you can do with MyChart, go to [email or web address].    Your next appointment:    Your physician wants you to follow-up in:  IN 6 MONTHS WITH DEVICE CLINIC You will receive a reminder letter in the mail two months in advance. If you don't receive a letter, please call our office to schedule the follow-up appointment.  1 year(s)  The format for your next appointment:   In Person  Provider:   You may see DR. TtAYLOR  or one of the  following Advanced Practice Providers on your designated Care Team:    Gypsy Balsam, NP  Francis Dowse, PA-C  Casimiro Needle "Mardelle Matte" Lanna Poche, [place]    Other Instructions

## 2019-07-10 NOTE — Addendum Note (Signed)
Addended by: Oleta Mouse on: 07/10/2019 12:52 PM   Modules accepted: Orders

## 2019-07-14 ENCOUNTER — Telehealth: Payer: Self-pay | Admitting: *Deleted

## 2019-07-14 ENCOUNTER — Other Ambulatory Visit: Payer: Self-pay

## 2019-07-14 NOTE — Telephone Encounter (Signed)
Spoke with patient and patient is not agreeable with a follow up echo for same reasoning of endocarditis.  Patient would need another reasoning agreeable with Dr [name] for her to do repeat Echocardiogram. Patient feels she has been through enough for trying to figure out about her valves.

## 2019-07-15 ENCOUNTER — Other Ambulatory Visit: Payer: Self-pay

## 2019-07-15 MED ORDER — CEPHALEXIN 500 MG PO CAPS: ORAL_CAPSULE | ORAL | 0 refills | Status: DC

## 2019-07-15 NOTE — Telephone Encounter (Signed)
Pt's husband calling requesting a refill on cephalexin for dental procedure. Pt has an appointment tomorrow. Would Dr. [name] like to refill this medication? Please address

## 2019-08-29 ENCOUNTER — Other Ambulatory Visit: Payer: Self-pay

## 2019-09-01 MED ORDER — CEPHALEXIN 500 MG PO CAPS: ORAL_CAPSULE | ORAL | 0 refills | Status: DC

## 2019-09-27 ENCOUNTER — Other Ambulatory Visit: Payer: Self-pay

## 2019-09-27 ENCOUNTER — Other Ambulatory Visit: Payer: Self-pay | Admitting: Internal Medicine

## 2019-09-28 ENCOUNTER — Other Ambulatory Visit: Payer: Self-pay | Admitting: Internal Medicine

## 2019-09-28 MED ORDER — CEPHALEXIN 500 MG PO CAPS: ORAL_CAPSULE | ORAL | 1 refills | Status: DC

## 2019-11-02 ENCOUNTER — Encounter (INDEPENDENT_AMBULATORY_CARE_PROVIDER_SITE_OTHER): Payer: Self-pay | Admitting: Ophthalmology

## 2019-11-02 ENCOUNTER — Other Ambulatory Visit: Payer: Self-pay

## 2019-11-02 ENCOUNTER — Ambulatory Visit (INDEPENDENT_AMBULATORY_CARE_PROVIDER_SITE_OTHER): Payer: Managed Care, Other (non HMO) | Admitting: Ophthalmology

## 2019-11-02 DIAGNOSIS — H43393 Other vitreous opacities, bilateral: Secondary | ICD-10-CM | POA: Diagnosis not present

## 2019-11-02 DIAGNOSIS — H4710 Unspecified papilledema: Secondary | ICD-10-CM | POA: Diagnosis not present

## 2019-11-02 DIAGNOSIS — H47012 Ischemic optic neuropathy, left eye: Secondary | ICD-10-CM | POA: Diagnosis not present

## 2019-11-02 NOTE — Progress Notes (Signed)
11/02/2019     CHIEF COMPLAINT Patient presents for Retina Follow Up   HISTORY OF PRESENT ILLNESS: [name] is a 59 y.o. female who presents to the clinic today for:   HPI    Retina Follow Up    Patient presents with  Other.  In both eyes.  This started 1 year ago.  Severity is mild.  Duration of 1 year.  Since onset it is stable.          Comments    1 Year F/U OU  Pt denies noticeable changes to [place] OU since last visit. Pt denies ocular pain, flashes of light, or floaters OU.         Last edited by Ileana Roup, COA on 11/02/2019  9:50 AM. (History)      Referring physician: No referring provider defined for this encounter.  HISTORICAL INFORMATION:   Selected notes from the MEDICAL RECORD NUMBER       CURRENT MEDICATIONS: No current outpatient medications on file. (Ophthalmic Drugs)   No current facility-administered medications for this visit. (Ophthalmic Drugs)   Current Outpatient Medications (Other)  Medication Sig  . aspirin EC 81 MG tablet Take 81 mg by mouth daily.  . calcium carbonate (TUMS - DOSED IN MG ELEMENTAL CALCIUM) 500 MG chewable tablet Chew 1 tablet by mouth as needed (Take as directed).   . cephALEXin (KEFLEX) 500 MG capsule TAKE 4 CAPSULES 1 HOUR PRIOR TO DENTAL APPOINTMENT  . cyclobenzaprine (FLEXERIL) 10 MG tablet Take 1 tablet by mouth 3 (three) times daily as needed for spasms.  . diphenhydrAMINE (BENADRYL) 25 mg capsule Take 25 mg by mouth every 6 (six) hours as needed for itching or allergies.  . diphenhydrAMINE (SOMINEX) 25 MG tablet Take 25 mg by mouth at bedtime as needed for sleep.  . furosemide (LASIX) 20 MG tablet Take 20 mg by mouth daily.  . hydrocortisone 1 % lotion Apply 1 application topically 2 (two) times daily.  Marland Kitchen ibuprofen (ADVIL,MOTRIN) 200 MG tablet Take 200 mg by mouth every 6 (six) hours as needed for pain.  Marland Kitchen levothyroxine (SYNTHROID, LEVOTHROID) 88 MCG tablet Take 88 mcg by mouth at bedtime.   . meloxicam  (MOBIC) 15 MG tablet Take 1 tablet by mouth daily as needed for pain.  . metoprolol (LOPRESSOR) 100 MG tablet Take 50 mg by mouth daily.    . pregabalin (LYRICA) 25 MG capsule Take 25 mg by mouth 3 (three) times daily.  Marland Kitchen trolamine salicylate (ASPERCREME) 10 % cream Apply 1 application topically as needed for muscle pain.   No current facility-administered medications for this visit. (Other)      REVIEW OF SYSTEMS:    ALLERGIES Allergies  Allergen Reactions  . Azithromycin Rash  . Doxycycline Rash  . Erythromycin Nausea And Vomiting  . Penicillins Rash  . Sulfamethoxazole-Trimethoprim Rash    PAST MEDICAL HISTORY Past Medical History:  Diagnosis Date  . Asthma   . Congenital complete AV block 1978  . Hypertension   . Hypothyroidism   . Presence of permanent cardiac pacemaker   . PVC (premature ventricular contraction)    Past Surgical History:  Procedure Laterality Date  . INSERT / REPLACE / REMOVE PACEMAKER     s/p PPM implant 1978 for congenital CHB   . PERMANENT PACEMAKER GENERATOR CHANGE N/A 04/25/2012   Procedure: PERMANENT PACEMAKER GENERATOR CHANGE;  Surgeon: Marinus Maw, MD;  Location: [facility] CATH LAB;  Service: Cardiovascular;  Laterality: N/A;  FAMILY HISTORY Family History  Problem Relation Age of Onset  . Heart disease Mother   . COPD Mother   . Hypothyroidism Father        she knows minimal about her father/his family  . Heart disease Maternal Grandfather   . Breast cancer Maternal Aunt     SOCIAL HISTORY Social History   Tobacco Use  . Smoking status: Never Smoker  . Smokeless tobacco: Never Used  Vaping Use  . Vaping Use: Never used  Substance Use Topics  . Alcohol use: No  . Drug use: No         OPHTHALMIC EXAM: Base Eye Exam    Visual Acuity (ETDRS)      Right Left   Dist cc 20/20 20/50   Dist ph cc  20/40   Correction: Glasses       Tonometry (Tonopen, 9:52 AM)      Right Left   Pressure 10 15       Pupils      Dark  Light Shape React APD   Right 4 3 Round Brisk None   Left 4 3 Round Brisk Trace       Visual Fields (Counting fingers)      Left Right     Full   Restrictions Total superior temporal deficiency        Extraocular Movement      Right Left    Full Full       Neuro/Psych    Oriented x3: Yes   Mood/Affect: Normal       Dilation    Both eyes: 1.0% Mydriacyl, 2.5% Phenylephrine @ 9:55 AM        Slit Lamp and Fundus Exam    External Exam      Right Left   External Normal Normal       Slit Lamp Exam      Right Left   Lids/Lashes Normal Normal   Conjunctiva/Sclera White and quiet White and quiet   Cornea Clear Clear   Anterior Chamber Deep and quiet Deep and quiet   Iris Round and reactive Round and reactive   Lens 1+ Nuclear sclerosis 1+ Nuclear sclerosis   Anterior Vitreous Normal Normal       Fundus Exam      Right Left   Posterior Vitreous Normal Normal   Disc Normal Normal   C/D Ratio 0.1 0.1   Macula Normal Normal   Vessels Normal Normal   Periphery Normal Normal          IMAGING AND PROCEDURES  Imaging and Procedures for 11/02/19  Color Fundus Photography Optos - OU - Both Eyes       Right Eye Progression has been stable. Disc findings include edema. Macula : normal observations. Vessels : normal observations. Periphery : normal observations.   Left Eye Progression has been stable. Disc findings include pallor.   Notes Discussed the findings of optic atrophy left eye with the patient.  Patient may have some signs review of systems for sleep apnea, yet she has never been tested.  I encouraged patient to consider testing for sleep apnea because of its known risk of making optic atrophy worse and what I believe is making potential for macular perfusion with known hypoxic stimulus triggering potential for vision loss during untreated sleep apnea.                ASSESSMENT/PLAN:  No problem-specific Assessment & Plan notes found for this  encounter.  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 Other vitreous opacities, bilateral  H43.393     1.Discussed the findings of optic atrophy left eye with the patient.  Patient may have some signs review of systems for sleep apnea, yet she has never been tested.  I encouraged patient to consider testing for sleep apnea because of its known risk of making optic atrophy worse and what I believe is making potential for macular perfusion with known hypoxic stimulus triggering potential for vision loss during untreated sleep apnea.    2.  3.  Ophthalmic Meds Ordered this visit:  No orders of the defined types were placed in this encounter.      Return in about 1 year (around 11/01/2020) for DILATE OU, OCT.  There are no Patient Instructions on file for this visit.   Explained the diagnoses, plan, and follow up with the patient and they expressed understanding.  Patient expressed understanding of the importance of proper follow up care.   Alford Highland Hilja Kintzel M.D. Diseases & Surgery of the Retina and Vitreous [facility] 11/02/19     Abbreviations: M myopia (nearsighted); A astigmatism; H hyperopia (farsighted); P presbyopia; Mrx spectacle prescription;  CTL contact lenses; OD right eye; OS left eye; OU both eyes  XT exotropia; ET esotropia; PEK punctate epithelial keratitis; PEE punctate epithelial erosions; DES dry eye syndrome; MGD meibomian gland dysfunction; ATs artificial tears; PFAT's preservative free artificial tears; NSC nuclear sclerotic cataract; PSC posterior subcapsular cataract; ERM epi-retinal membrane; PVD posterior vitreous detachment; RD retinal detachment; DM diabetes mellitus; DR diabetic retinopathy; NPDR non-proliferative diabetic retinopathy; PDR proliferative diabetic retinopathy; CSME clinically significant macular edema; DME  diabetic macular edema; dbh dot blot hemorrhages; CWS cotton wool spot; POAG primary open angle glaucoma; C/D cup-to-disc ratio; HVF humphrey visual field; GVF goldmann visual field; OCT optical coherence tomography; IOP intraocular pressure; BRVO Branch retinal vein occlusion; CRVO central retinal vein occlusion; CRAO central retinal artery occlusion; BRAO branch retinal artery occlusion; RT retinal tear; SB scleral buckle; PPV pars plana vitrectomy; VH Vitreous hemorrhage; PRP panretinal laser photocoagulation; IVK intravitreal kenalog; VMT vitreomacular traction; MH Macular hole;  NVD neovascularization of the disc; NVE neovascularization elsewhere; AREDS age related eye disease study; ARMD age related macular degeneration; POAG primary open angle glaucoma; EBMD epithelial/anterior basement membrane dystrophy; ACIOL anterior chamber intraocular lens; IOL intraocular lens; PCIOL posterior chamber intraocular lens; Phaco/IOL phacoemulsification with intraocular lens placement; PRK photorefractive keratectomy; LASIK laser assisted in situ keratomileusis; HTN hypertension; DM diabetes mellitus; COPD chronic obstructive pulmonary disease

## 2019-11-06 ENCOUNTER — Encounter (INDEPENDENT_AMBULATORY_CARE_PROVIDER_SITE_OTHER): Payer: Self-pay | Admitting: Ophthalmology

## 2019-11-08 ENCOUNTER — Telehealth: Payer: Self-pay | Admitting: Medical

## 2019-11-08 NOTE — Telephone Encounter (Signed)
Patient called to say her husband tested positive 5 days ago. Patient got COVID tested yesterday and she got home she started running a fever and had a cough. No chest pain. Just wanted to inform Dr. [name] since she has a pacemaker a hx of endocarditis.   Tonnette Zwiebel Fransico Michael, PA-C

## 2019-11-10 ENCOUNTER — Telehealth: Payer: Self-pay | Admitting: Internal Medicine

## 2019-11-10 NOTE — Telephone Encounter (Signed)
° ° °  Pt said she was tested positive for covid 19 last Monday. She said she just needs some guidance what to look out for, she is concerned about her pericarditis. She also wanted to know why she can't message Dr. [name] on her mychart acct. She said she spoke with tech support in [place] and she was told Dr. [name] is not listed as her doctor, she is wondering someone can help her with that.

## 2019-11-11 NOTE — Telephone Encounter (Signed)
See my chart response .

## 2019-11-15 ENCOUNTER — Other Ambulatory Visit: Payer: Self-pay | Admitting: Adult Health

## 2019-11-15 DIAGNOSIS — U071 COVID-19: Secondary | ICD-10-CM

## 2019-11-15 NOTE — Progress Notes (Signed)
I connected by phone with [name] on 11/15/2019 at 3:39 PM to discuss the potential use of a new treatment for mild to moderate COVID-19 viral infection in non-hospitalized patients.  This patient is a 59 y.o. female that meets the FDA criteria for Emergency Use Authorization of COVID monoclonal antibody casirivimab/imdevimab or bamlanivimab/eteseviamb.  Has a (+) direct SARS-CoV-2 viral test result  Has mild or moderate COVID-19   Is NOT hospitalized due to COVID-19  Is within 10 days of symptom onset  Has at least one of the high risk factor(s) for progression to severe COVID-19 and/or hospitalization as defined in EUA.  Specific high risk criteria : Cardiovascular disease or hypertension   I have spoken and communicated the following to the patient or parent/caregiver regarding COVID monoclonal antibody treatment:  1. FDA has authorized the emergency use for the treatment of mild to moderate COVID-19 in adults and pediatric patients with positive results of direct SARS-CoV-2 viral testing who are 81 years of age and older weighing at least 40 kg, and who are at high risk for progressing to severe COVID-19 and/or hospitalization.  2. The significant known and potential risks and benefits of COVID monoclonal antibody, and the extent to which such potential risks and benefits are unknown.  3. Information on available alternative treatments and the risks and benefits of those alternatives, including clinical trials.  4. Patients treated with COVID monoclonal antibody should continue to self-isolate and use infection control measures (e.g., wear mask, isolate, social distance, avoid sharing personal items, clean and disinfect "high touch" surfaces, and frequent handwashing) according to CDC guidelines.   5. The patient or parent/caregiver has the option to accept or refuse COVID monoclonal antibody treatment.  After reviewing this information with the patient, the patient has  agreed to receive one of the available covid 19 monoclonal antibodies and will be provided an appropriate fact sheet prior to infusion.  Set up for 10/4 at 1330 .   Rubye Oaks, NP 11/15/2019 3:39 PM    id

## 2019-11-16 ENCOUNTER — Ambulatory Visit (HOSPITAL_COMMUNITY)
Admission: RE | Admit: 2019-11-16 | Discharge: 2019-11-16 | Disposition: A | Discharge status: Home / Self Care | Payer: Managed Care, Other (non HMO) | Source: Ambulatory Visit | Attending: Pulmonary Disease | Admitting: Pulmonary Disease

## 2019-11-16 DIAGNOSIS — U071 COVID-19: Secondary | ICD-10-CM | POA: Diagnosis not present

## 2019-11-16 MED ORDER — METHYLPREDNISOLONE SODIUM SUCC 125 MG IJ SOLR: 125.0000 mg | Freq: Once | INTRAMUSCULAR | Status: DC | PRN

## 2019-11-16 MED ORDER — EPINEPHRINE 0.3 MG/0.3ML IJ SOAJ: 0.3000 mg | Freq: Once | INTRAMUSCULAR | Status: DC | PRN

## 2019-11-16 MED ORDER — SODIUM CHLORIDE 0.9 % IV SOLN: Freq: Once | INTRAVENOUS | Status: AC

## 2019-11-16 MED ORDER — DIPHENHYDRAMINE HCL 50 MG/ML IJ SOLN: 50.0000 mg | Freq: Once | INTRAMUSCULAR | Status: DC | PRN

## 2019-11-16 MED ORDER — SODIUM CHLORIDE 0.9 % IV SOLN
1200.0000 mg | Freq: Once | INTRAVENOUS | Status: AC
  Administered 2019-11-16: 1200 mg via INTRAVENOUS

## 2019-11-16 MED ORDER — FAMOTIDINE IN NACL 20-0.9 MG/50ML-% IV SOLN: 20.0000 mg | Freq: Once | INTRAVENOUS | Status: DC | PRN

## 2019-11-16 MED ORDER — ALBUTEROL SULFATE HFA 108 (90 BASE) MCG/ACT IN AERS: 2.0000 | INHALATION_SPRAY | Freq: Once | RESPIRATORY_TRACT | Status: DC | PRN

## 2019-11-16 MED ORDER — SODIUM CHLORIDE 0.9 % IV SOLN: INTRAVENOUS | Status: DC | PRN

## 2019-11-16 NOTE — Progress Notes (Signed)
  Diagnosis: COVID-19  Physician: Dr. Shan Levans  Procedure: Covid Infusion Clinic Med: casirivimab\imdevimab infusion - Provided patient with casirivimab\imdevimab fact sheet for patients, parents and caregivers prior to infusion.  Complications: No immediate complications noted.  Discharge: Discharged home   [name] 11/16/2019

## 2019-11-16 NOTE — Progress Notes (Deleted)
Patient ID: [name], female   DOB: November 23, 1960, 59 y.o.   MRN: [id number]  Diagnosis: COVID-19  Physician: Dr. Delford Field  Procedure: Covid Infusion Clinic Med: casirivimab\imdevimab infusion - Provided patient with casirivimab\imdevimab fact sheet for patients, parents and caregivers prior to infusion.  Complications: No immediate complications noted.  Discharge: Discharged home   Kateri Plummer 11/16/2019

## 2019-11-16 NOTE — Discharge Instructions (Signed)

## 2020-01-04 ENCOUNTER — Telehealth: Payer: Self-pay | Admitting: Emergency Medicine

## 2020-01-04 NOTE — Telephone Encounter (Signed)
Patient appointment changed to 01/05/20 at 0920.

## 2020-01-05 ENCOUNTER — Other Ambulatory Visit: Payer: Self-pay

## 2020-01-05 ENCOUNTER — Ambulatory Visit (INDEPENDENT_AMBULATORY_CARE_PROVIDER_SITE_OTHER): Payer: Managed Care, Other (non HMO) | Admitting: Emergency Medicine

## 2020-01-05 DIAGNOSIS — Z950 Presence of cardiac pacemaker: Secondary | ICD-10-CM | POA: Diagnosis not present

## 2020-01-05 DIAGNOSIS — Q246 Congenital heart block: Secondary | ICD-10-CM

## 2020-01-05 LAB — CUP PACEART INCLINIC DEVICE CHECK
Battery Impedance: 4100 Ohm
Battery Voltage: 2.74 V
Brady Statistic RA Percent Paced: 90 %
Brady Statistic RV Percent Paced: 99 %
Date Time Interrogation Session: 20211123111346
Implantable Lead Implant Date: 20070629
Implantable Lead Implant Date: 20070629
Implantable Lead Location: 753859
Implantable Lead Location: 753860
Implantable Lead Model: 511212
Implantable Lead Model: 511212
Implantable Lead Serial Number: 109900
Implantable Lead Serial Number: 109901
Implantable Pulse Generator Implant Date: 20140314
Lead Channel Impedance Value: 324 Ohm
Lead Channel Impedance Value: 366 Ohm
Lead Channel Pacing Threshold Amplitude: 0.75 V
Lead Channel Pacing Threshold Amplitude: 1 V
Lead Channel Pacing Threshold Pulse Width: 0.4 ms
Lead Channel Pacing Threshold Pulse Width: 0.5 ms
Lead Channel Sensing Intrinsic Amplitude: 0.3 mV
Lead Channel Setting Pacing Amplitude: 2 V
Lead Channel Setting Pacing Amplitude: 2.5 V
Lead Channel Setting Pacing Pulse Width: 0.5 ms
Lead Channel Setting Sensing Sensitivity: 6 mV
Pulse Gen Model: 5826
Pulse Gen Serial Number: 7424521

## 2020-01-05 NOTE — Patient Instructions (Signed)
Follow-up with Dr [name] in May , 2022.

## 2020-01-05 NOTE — Progress Notes (Signed)
Pacemaker check in clinic. Normal device function. Thresholds, sensing, impedances consistent with previous measurements. Device programmed to maximize longevity. No mode switch or high ventricular rates noted. Device programmed at appropriate safety margins. Histogram distribution appropriate for patient activity level. Device programmed to optimize intrinsic conduction. Estimated longevity 1 year 6 months. Patient not enrolled in remote follow-up. Patient education completed. Patient with have in clinic check at 1 year follow-up with Dr [name] 07/2020.

## 2020-01-22 ENCOUNTER — Other Ambulatory Visit: Payer: Self-pay

## 2020-01-22 ENCOUNTER — Ambulatory Visit: Payer: Managed Care, Other (non HMO) | Admitting: Internal Medicine

## 2020-01-22 ENCOUNTER — Encounter: Payer: Self-pay | Admitting: Internal Medicine

## 2020-01-22 VITALS — BP 130/80 | HR 73 | Temp 98.4°F | Ht 63.25 in | Wt 190.0 lb

## 2020-01-22 DIAGNOSIS — U071 COVID-19: Secondary | ICD-10-CM

## 2020-01-22 DIAGNOSIS — J069 Acute upper respiratory infection, unspecified: Secondary | ICD-10-CM

## 2020-01-22 NOTE — Patient Instructions (Signed)
The patient should have follow up scheduled as needed.

## 2020-01-22 NOTE — Progress Notes (Signed)
[name]    [id number]    Apr 27, 1960  Primary Care Physician:Kim, Fayrene Fearing, MD  Referring Physician: Pearson Grippe, MD [address] [place],  [place] [address] Reason for Consultation: cough Date of Consultation: 01/22/2020  Chief complaint:   Chief Complaint  Patient presents with  . Consult    S/p covid (sept/oct), pna in November.  Received infusion in October.  Sob, cough.  Cold since Thursday 01/14/20, sneezing, coughing, head congestion.       HPI:  AKIA MONTALBAN is a 59 y.o. unvaccinated woman who had covid 19 infection in September 2021. Treated with outpatient monoclonal ab infusion on October 4th. Got better towards end of October. Asked for an xray to see if there was damage to the lungs.  She got treated with an antibiotic in November and felt marginally better. She caught a cold and went to a football game. Has been coughing and sneezing all week. Appetite is ok, no fevers, husband has something the week as well which is where she thinks she got this current infection.  Drinking alcohol to help with her cough and fall asleep. Does not want to take any nasal sprays or sudafed due to her blood pressure and history of A. Fib. Her biggest concern today is if she has any permanent damage done to her lungs from the covid.    Social history:  Occupation: works as an Advertising account executive Smoking history: never smoker.   Social History   Occupational History  . Not on file  Tobacco Use  . Smoking status: Never Smoker  . Smokeless tobacco: Never Used  Vaping Use  . Vaping Use: Never used  Substance and Sexual Activity  . Alcohol use: No  . Drug use: No  . Sexual activity: Not on file    Relevant family history:  Family History  Problem Relation Age of Onset  . Heart disease Mother   . COPD Mother   . Hypothyroidism Father        she knows minimal about her father/his family  . Heart disease Maternal Grandfather   . Breast  cancer Maternal Aunt     Past Medical History:  Diagnosis Date  . Asthma   . Congenital complete AV block 1978  . Hypertension   . Hypothyroidism   . Presence of permanent cardiac pacemaker   . PVC (premature ventricular contraction)     Past Surgical History:  Procedure Laterality Date  . INSERT / REPLACE / REMOVE PACEMAKER     s/p PPM implant 1978 for congenital CHB   . PERMANENT PACEMAKER GENERATOR CHANGE N/A 04/25/2012   Procedure: PERMANENT PACEMAKER GENERATOR CHANGE;  Surgeon: Marinus Maw, MD;  Location: [facility] CATH LAB;  Service: Cardiovascular;  Laterality: N/A;     Physical Exam: Blood pressure 130/80, pulse 73, temperature 98.4 F (36.9 C), temperature source Temporal, height 5' 3.25" (1.607 m), weight 190 lb (86.2 kg), last menstrual period 11/22/2011, SpO2 95 %. Gen:      No acute distress ENT:  no nasal polyps, mucus membranes moist Lungs:    No increased respiratory effort, symmetric chest wall excursion, clear to auscultation bilaterally, no wheezes or crackles CV:         Regular rate and rhythm; no murmurs, rubs, or gallops.  No pedal edema Abd:      + bowel sounds; soft, non-tender; no distension MSK: no acute synovitis of DIP or PIP joints, no  mechanics hands.  Skin:      Warm and dry; no rashes Neuro: normal speech, no focal facial asymmetry Psych: alert and oriented x3, normal mood and affect   Data Reviewed/Medical Decision Making:  Independent interpretation of tests: Imaging: . Review of patient's chest images Dec 15 2019 images revealed scattered multi-focal opacities consistent with covid 19 infection. The patient's images have been independently reviewed by me.    PFTs: None on file  Labs:   Immunization status:  Immunization History  Administered Date(s) Administered  . Influenza,inj,Quad PF,6+ Mos 12/15/2019    . I reviewed prior external note(s) from pcp . I reviewed the result(s) of the labs and maging as noted above.  . I have  ordered PFTs   Assessment:  Acute Upper Respiratory Infection Covid 19 infection  Plan/Recommendations: Ms. Tiggs presents for new patient evaluation post-covid 19 infection. She additional has an acute URI today.  Her exam is reassuring - she has no crackles or hypoxemia on exam, her vital signs are stable. I do not think this is related to her previous covid infection based on her history. She had a chest xray drawn only a month after symptoms which shows some resolving opacities - this is not surprising given it can take over 2 months for a chest xray to clear after LRTI. I offered her supportive care for URI - offered nasal sprays and anti-histamines, patient declined.  Regarding her concern for damage to her lungs, we discussed repeating a chest xray and obtaining pulmonary function testing. Based on my evaluation of her I think the likelihood of fibrosis is low. She expressed concern about cost of testing. At any rate we would not perform testing while she is feeling unwell and she needs to be 90 days out from her covid test.  I offered her a follow up as needed should her breathing and cough not continue to improve.   I spent 46 minutes in the care of this patient today including pre-charting, chart review, review of results, face-to-face care, coordination of care and communication with consultants etc.).  Return to Care: PRN  Durel Salts, MD Pulmonary and Critical Care Medicine [facility] Office:[phone]  CC: Pearson Grippe, MD

## 2020-07-05 ENCOUNTER — Other Ambulatory Visit: Payer: Self-pay | Admitting: Physician Assistant

## 2020-07-05 DIAGNOSIS — I442 Atrioventricular block, complete: Secondary | ICD-10-CM

## 2020-08-01 ENCOUNTER — Other Ambulatory Visit: Payer: Self-pay | Admitting: Internal Medicine

## 2020-09-23 ENCOUNTER — Other Ambulatory Visit: Payer: Self-pay

## 2020-09-23 ENCOUNTER — Ambulatory Visit: Payer: Managed Care, Other (non HMO) | Admitting: Internal Medicine

## 2020-09-23 VITALS — BP 122/68 | HR 70 | Ht 63.0 in | Wt 191.2 lb

## 2020-09-23 DIAGNOSIS — I442 Atrioventricular block, complete: Secondary | ICD-10-CM | POA: Diagnosis not present

## 2020-09-23 DIAGNOSIS — I10 Essential (primary) hypertension: Secondary | ICD-10-CM | POA: Diagnosis not present

## 2020-09-23 DIAGNOSIS — Z950 Presence of cardiac pacemaker: Secondary | ICD-10-CM | POA: Diagnosis not present

## 2020-09-23 NOTE — Progress Notes (Signed)
HPI [name] returns today for followup. She is a very pleasant 60 year old woman with a history of congenital complete heart block, status post permanent pacemaker insertion. She has an epicardial system. In the interim, she has done well. She denies chest pain, shortness of breath, or syncope. Minimal peripheral edema. This is associated with prolonged upright activity. She is working full time.She denies syncope. She admits to dietary indiscretion and has gained weight. Allergies  Allergen Reactions   Azithromycin Rash   Doxycycline Rash   Erythromycin Nausea And Vomiting   Penicillins Rash   Sulfamethoxazole-Trimethoprim Rash     Current Outpatient Medications  Medication Sig Dispense Refill   aspirin EC 81 MG tablet Take 81 mg by mouth daily.     calcium carbonate (TUMS - DOSED IN MG ELEMENTAL CALCIUM) 500 MG chewable tablet Chew 1 tablet by mouth as needed (Take as directed).      cephALEXin (KEFLEX) 500 MG capsule TAKE 4 CAPSULES 1 HOUR PRIOR TO PROCEDURE. 4 capsule 1   cyclobenzaprine (FLEXERIL) 10 MG tablet Take 1 tablet by mouth 3 (three) times daily as needed for spasms.     diphenhydrAMINE (BENADRYL) 25 mg capsule Take 25 mg by mouth every 6 (six) hours as needed for itching or allergies.     diphenhydrAMINE (SOMINEX) 25 MG tablet Take 25 mg by mouth at bedtime as needed for sleep.     furosemide (LASIX) 20 MG tablet Take 20 mg by mouth daily.     hydrocortisone 1 % lotion Apply 1 application topically 2 (two) times daily. 118 mL 0   ibuprofen (ADVIL,MOTRIN) 200 MG tablet Take 200 mg by mouth every 6 (six) hours as needed for pain.     levothyroxine (SYNTHROID, LEVOTHROID) 88 MCG tablet Take 88 mcg by mouth at bedtime.      meloxicam (MOBIC) 15 MG tablet Take 1 tablet by mouth daily as needed for pain.     metoprolol (LOPRESSOR) 100 MG tablet Take 50 mg by mouth daily.     pregabalin (LYRICA) 25 MG capsule Take 25 mg by mouth 2 (two) times daily.      RA Vitamin  E-Vit A & D 20000 units CREA      trolamine salicylate (ASPERCREME) 10 % cream Apply 1 application topically as needed for muscle pain.     Trolamine Salicylate (ASPERCREME) 10 % LOTN      No current facility-administered medications for this visit.     Past Medical History:  Diagnosis Date   Asthma    Congenital complete AV block 1978   Hypertension    Hypothyroidism    Presence of permanent cardiac pacemaker    PVC (premature ventricular contraction)     ROS:   All systems reviewed and negative except as noted in the HPI.   Past Surgical History:  Procedure Laterality Date   INSERT / REPLACE / REMOVE PACEMAKER     s/p PPM implant 1978 for congenital CHB    PERMANENT PACEMAKER GENERATOR CHANGE N/A 04/25/2012   Procedure: PERMANENT PACEMAKER GENERATOR CHANGE;  Surgeon: Marinus Maw, MD;  Location[facility] CATH LAB;  Service: Cardiovascular;  Laterality: N/A;     Family History  Problem Relation Age of Onset   Heart disease Mother    COPD Mother    Hypothyroidism Father        she knows minimal about her father/his family   Heart disease Maternal Grandfather    Breast cancer Maternal Aunt  Social History   Socioeconomic History   Marital status: Married    Spouse name: Not on file   Number of children: Not on file   Years of education: Not on file   Highest education level: Not on file  Occupational History   Not on file  Tobacco Use   Smoking status: Never   Smokeless tobacco: Never  Vaping Use   Vaping Use: Never used  Substance and Sexual Activity   Alcohol use: No   Drug use: No   Sexual activity: Not on file  Other Topics Concern   Not on file  Social History Narrative   Not on file   Social Determinants of Health   Financial Resource Strain: Not on file  Food Insecurity: Not on file  Transportation Needs: Not on file  Physical Activity: Not on file  Stress: Not on file  Social Connections: Not on file  Intimate Partner Violence: Not on  file     BP 122/68   Pulse 70   Ht 5\' 3"  (1.6 m)   Wt 191 lb 3.2 oz (86.7 kg)   LMP 11/22/2011   SpO2 97%   BMI 33.87 kg/m   Physical Exam:  Well appearing NAD HEENT: Unremarkable Neck:  No JVD, no thyromegally Lymphatics:  No adenopathy Back:  No CVA tenderness Lungs:  Clear with no wheezes HEART:  Regular rate rhythm, no murmurs, no rubs, no clicks Abd:  soft, positive bowel sounds, no organomegally, no rebound, no guarding Ext:  2 plus pulses, no edema, no cyanosis, no clubbing Skin:  No rashes no nodules Neuro:  CN II through XII intact, motor grossly intact  EKG - NSR with AV pacing  DEVICE  Normal device function.  See PaceArt for details.   Assess/Plan:   1. Complete heart block - she is stable s/p PPM insertion. 2. PPM - her device is working normally. We will recheck in several months. She has about 1.4 years to ERI 3. Obesity - we have encouraged the patient to lose weight, reduce her sodium intake and increase her activity. 4. HTN - her bp is controlled on a beta blocker and a diuretic.  01/22/2012 [name],M.D.

## 2020-09-23 NOTE — Patient Instructions (Addendum)
Medication Instructions:  Your physician recommends that you continue on your current medications as directed. Please refer to the Current Medication list given to you today.  Labwork: None ordered.  Testing/Procedures: None ordered.  Follow-Up:  Your physician wants you to follow-up in: 3 months with the device clinic.     December 28, 2020 at 4:00 pm  Your physician wants you to follow-up in: one year with [name], MD or one of the following Advanced Practice Providers on your designated Care Team:   Francis Dowse, [place] Casimiro Needle "Mardelle Matte" Lanna Poche, [place]   Any Other Special Instructions Will Be Listed Below (If Applicable).  If you need a refill on your cardiac medications before your next appointment, please call your pharmacy.

## 2020-11-07 ENCOUNTER — Other Ambulatory Visit: Payer: Self-pay

## 2020-11-07 ENCOUNTER — Ambulatory Visit (INDEPENDENT_AMBULATORY_CARE_PROVIDER_SITE_OTHER): Payer: Managed Care, Other (non HMO) | Admitting: Ophthalmology

## 2020-11-07 ENCOUNTER — Encounter (INDEPENDENT_AMBULATORY_CARE_PROVIDER_SITE_OTHER): Payer: Self-pay | Admitting: Ophthalmology

## 2020-11-07 DIAGNOSIS — H47012 Ischemic optic neuropathy, left eye: Secondary | ICD-10-CM

## 2020-11-07 DIAGNOSIS — H2513 Age-related nuclear cataract, bilateral: Secondary | ICD-10-CM

## 2020-11-07 NOTE — Progress Notes (Signed)
11/07/2020     CHIEF COMPLAINT Patient presents for  Chief Complaint  Patient presents with   Retina Follow Up      HISTORY OF PRESENT ILLNESS: [name] is a 60 y.o. female who presents to the clinic today for:   HPI     Retina Follow Up   Patient presents with  Other.  In both eyes.  This started 1 year ago.  Severity is mild.  Duration of 1 year.  Since onset it is stable.        Comments   1 yr fu ou oct. Patient states vision is stable and unchanged since last visit. Denies any new floaters or FOL. Pt states "I use +2.5 readers as her regular glasses, like when watching t.v., and any fine print. If I have to read something really small I put another pair of +2.5 on top of those. I have vision insurance I just think the readers work fine."      Last edited by Nelva Nay on 11/07/2020  9:44 AM.      Referring physician: Pearson Grippe, MD [address] [place],  [place] [address]  HISTORICAL INFORMATION:   Selected notes from the MEDICAL RECORD NUMBER       CURRENT MEDICATIONS: No current outpatient medications on file. (Ophthalmic Drugs)   No current facility-administered medications for this visit. (Ophthalmic Drugs)   Current Outpatient Medications (Other)  Medication Sig   aspirin EC 81 MG tablet Take 81 mg by mouth daily.   calcium carbonate (TUMS - DOSED IN MG ELEMENTAL CALCIUM) 500 MG chewable tablet Chew 1 tablet by mouth as needed (Take as directed).    cephALEXin (KEFLEX) 500 MG capsule TAKE 4 CAPSULES 1 HOUR PRIOR TO PROCEDURE.   cyclobenzaprine (FLEXERIL) 10 MG tablet Take 1 tablet by mouth 3 (three) times daily as needed for spasms.   diphenhydrAMINE (BENADRYL) 25 mg capsule Take 25 mg by mouth every 6 (six) hours as needed for itching or allergies.   diphenhydrAMINE (SOMINEX) 25 MG tablet Take 25 mg by mouth at bedtime as needed for sleep.   furosemide (LASIX) 20 MG tablet Take 20 mg by mouth daily.   hydrocortisone 1 %  lotion Apply 1 application topically 2 (two) times daily.   ibuprofen (ADVIL,MOTRIN) 200 MG tablet Take 200 mg by mouth every 6 (six) hours as needed for pain.   levothyroxine (SYNTHROID, LEVOTHROID) 88 MCG tablet Take 88 mcg by mouth at bedtime.    meloxicam (MOBIC) 15 MG tablet Take 1 tablet by mouth daily as needed for pain.   metoprolol (LOPRESSOR) 100 MG tablet Take 50 mg by mouth daily.   pregabalin (LYRICA) 25 MG capsule Take 25 mg by mouth 2 (two) times daily.    RA Vitamin E-Vit A & D 20000 units CREA    trolamine salicylate (ASPERCREME) 10 % cream Apply 1 application topically as needed for muscle pain.   Trolamine Salicylate (ASPERCREME) 10 % LOTN    No current facility-administered medications for this visit. (Other)      REVIEW OF SYSTEMS:    ALLERGIES Allergies  Allergen Reactions   Azithromycin Rash   Doxycycline Rash   Erythromycin Nausea And Vomiting   Penicillins Rash   Sulfamethoxazole-Trimethoprim Rash    PAST MEDICAL HISTORY Past Medical History:  Diagnosis Date   Asthma    Congenital complete AV block 1978   Hypertension    Hypothyroidism    Presence of permanent cardiac pacemaker  PVC (premature ventricular contraction)    Past Surgical History:  Procedure Laterality Date   INSERT / REPLACE / REMOVE PACEMAKER     s/p PPM implant 1978 for congenital CHB    PERMANENT PACEMAKER GENERATOR CHANGE N/A 04/25/2012   Procedure: PERMANENT PACEMAKER GENERATOR CHANGE;  Surgeon: Marinus Maw, MD;  Location: [facility] CATH LAB;  Service: Cardiovascular;  Laterality: N/A;    FAMILY HISTORY Family History  Problem Relation Age of Onset   Heart disease Mother    COPD Mother    Hypothyroidism Father        she knows minimal about her father/his family   Heart disease Maternal Grandfather    Breast cancer Maternal Aunt     SOCIAL HISTORY Social History   Tobacco Use   Smoking status: Never   Smokeless tobacco: Never  Vaping Use   Vaping Use: Never used   Substance Use Topics   Alcohol use: No   Drug use: No         OPHTHALMIC EXAM:  Base Eye Exam     Visual Acuity (ETDRS)       Right Left   Dist Iron Post  20/200   Dist cc 20/20 20/100 -1   Dist ph cc  20/25    Correction: Glasses  Pt is using OTC Readers +2.5        Tonometry (Tonopen, 9:48 AM)       Right Left   Pressure 11 13         Pupils       Pupils Dark Light APD   Right PERRL 4 3 None   Left PERRL 4 3 None         Visual Fields (Counting fingers)       Left Right     Full   Restrictions Total superior temporal deficiency          Extraocular Movement       Right Left    Full Full         Neuro/Psych     Oriented x3: Yes   Mood/Affect: Normal         Dilation     Both eyes: 1.0% Mydriacyl, 2.5% Phenylephrine @ 9:48 AM           Slit Lamp and Fundus Exam     External Exam       Right Left   External Normal Normal         Slit Lamp Exam       Right Left   Lids/Lashes Normal Normal   Conjunctiva/Sclera White and quiet White and quiet   Cornea Clear Clear   Anterior Chamber Deep and quiet Deep and quiet   Iris Round and reactive Round and reactive   Lens 1+ Nuclear sclerosis 1+ Nuclear sclerosis   Anterior Vitreous Normal Normal         Fundus Exam       Right Left   Posterior Vitreous Normal Normal   Disc Normal 1+ Optic disc atrophy, 1+ Pallor   C/D Ratio 0.1 0.2   Macula Normal Normal   Vessels Normal Normal   Periphery Normal Normal           Refraction     Wearing Rx     Type: OTC Readers            IMAGING AND PROCEDURES  Imaging and Procedures for 11/07/20  OCT, Retina - OU - Both Eyes  Right Eye Quality was good. Scan locations included subfoveal. Central Foveal Thickness: 252. Progression has been stable. Findings include normal observations, normal foveal contour.   Left Eye Quality was good. Scan locations included subfoveal. Central Foveal Thickness: 247.  Progression has been stable. Findings include normal observations, normal foveal contour.   Notes OU with no change in macular findings.  OS with diffuse RNFL thinning Secondary to prior AI ON     OCT, Optic Nerve - OU - Both Eyes       Right Eye Reliability was good. Temporal thickness was normal. Superior thickness was normal. Nasal thickness was normal. Inferior thickness was normal.   Left Eye Reliability was good. Superior thickness was showing abnormal thinning. Inferior progression was stable. Inferior thickness was showing abnormal thinning.   Notes OD with normal, thickening OS RNFL changes  OS with stable RNFL changes coincident with prior AI ON from 4 years previous no signs of progression             ASSESSMENT/PLAN:  Nuclear sclerotic cataract of both eyes Bilateral mild NSC changes OU.  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  No signs of RNFL thinning worsening in either eye.     ICD-10-CM   1. 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 Nuclear sclerotic cataract of both eyes  H25.13       1.  OU with mild NSC changes.  Needs general eye care, will refer to Dr. Alden Hipp or [facility] eye care with Mathis Fare the physicians assistant  2.  Mild AI ON OS in the past, stable over the last 4 years.  Will observe  3.  Ophthalmic Meds Ordered this visit:  No orders of the defined types were placed in this encounter.      Return in about 2 years (around 11/08/2022) for COLOR FP, dilate, OCT.  There are no Patient Instructions on file for this visit.   Explained the diagnoses, plan, and follow up with the patient and they expressed understanding.  Patient expressed understanding of the importance of proper follow up care.   Alford Highland Dezirae Service M.D. Diseases & Surgery of the Retina and Vitreous [facility] 11/07/20     Abbreviations: M myopia (nearsighted); A astigmatism; H hyperopia (farsighted); P presbyopia; Mrx spectacle prescription;  CTL contact lenses; OD right eye; OS left eye; OU both eyes  XT exotropia; ET esotropia; PEK punctate epithelial keratitis; PEE punctate epithelial erosions; DES dry eye syndrome; MGD meibomian gland dysfunction; ATs artificial tears; PFAT's preservative free artificial tears; NSC nuclear sclerotic cataract; PSC posterior subcapsular cataract; ERM epi-retinal membrane; PVD posterior vitreous detachment; RD retinal detachment; DM diabetes mellitus; DR diabetic retinopathy; NPDR non-proliferative diabetic retinopathy; PDR proliferative diabetic retinopathy; CSME clinically significant macular edema; DME diabetic macular edema; dbh dot blot hemorrhages; CWS cotton wool spot; POAG primary open angle glaucoma; C/D cup-to-disc ratio; HVF humphrey visual field; GVF goldmann visual field; OCT optical coherence tomography; IOP intraocular pressure; BRVO Branch retinal vein occlusion; CRVO central retinal vein occlusion; CRAO central retinal artery occlusion; BRAO branch retinal artery occlusion; RT retinal tear; SB scleral buckle; PPV pars plana vitrectomy; VH Vitreous hemorrhage; PRP panretinal laser photocoagulation; IVK intravitreal kenalog; VMT vitreomacular traction; MH Macular hole;  NVD neovascularization of the disc; NVE neovascularization elsewhere; AREDS age related eye disease study; ARMD age related macular degeneration; POAG primary open angle glaucoma; EBMD epithelial/anterior basement membrane dystrophy; ACIOL anterior chamber intraocular lens; IOL intraocular lens; PCIOL posterior chamber intraocular lens; Phaco/IOL phacoemulsification with intraocular lens placement; Pinson photorefractive keratectomy; LASIK laser assisted in situ keratomileusis; HTN hypertension; DM diabetes  mellitus; COPD chronic obstructive pulmonary disease

## 2020-11-07 NOTE — Assessment & Plan Note (Signed)
Bilateral mild NSC changes OU.  Patient does deserve general eye evaluation and following this condition over time

## 2020-11-07 NOTE — Assessment & Plan Note (Signed)
Patient reports excellent blood pressure control ongoing.  No signs of RNFL thinning worsening in either eye.

## 2020-12-28 ENCOUNTER — Ambulatory Visit (INDEPENDENT_AMBULATORY_CARE_PROVIDER_SITE_OTHER): Payer: Managed Care, Other (non HMO)

## 2020-12-28 ENCOUNTER — Other Ambulatory Visit: Payer: Self-pay

## 2020-12-28 DIAGNOSIS — Q246 Congenital heart block: Secondary | ICD-10-CM | POA: Diagnosis not present

## 2020-12-28 LAB — CUP PACEART INCLINIC DEVICE CHECK
Battery Impedance: 7200 Ohm
Battery Voltage: 2.69 V
Brady Statistic RA Percent Paced: 93 %
Brady Statistic RV Percent Paced: 99 %
Date Time Interrogation Session: 20221116164028
Implantable Lead Implant Date: 20070629
Implantable Lead Implant Date: 20070629
Implantable Lead Location: 753859
Implantable Lead Location: 753860
Implantable Lead Model: 511212
Implantable Lead Model: 511212
Implantable Lead Serial Number: 109900
Implantable Lead Serial Number: 109901
Implantable Pulse Generator Implant Date: 20140314
Lead Channel Impedance Value: 336 Ohm
Lead Channel Impedance Value: 380 Ohm
Lead Channel Pacing Threshold Amplitude: 0.75 V
Lead Channel Pacing Threshold Amplitude: 1 V
Lead Channel Pacing Threshold Pulse Width: 0.4 ms
Lead Channel Pacing Threshold Pulse Width: 0.5 ms
Lead Channel Sensing Intrinsic Amplitude: 0.7 mV
Lead Channel Setting Pacing Amplitude: 2 V
Lead Channel Setting Pacing Amplitude: 2.5 V
Lead Channel Setting Pacing Pulse Width: 0.5 ms
Lead Channel Setting Sensing Sensitivity: 6 mV
Pulse Gen Model: 5826
Pulse Gen Serial Number: 7424521

## 2020-12-28 NOTE — Progress Notes (Signed)
Pacemaker check in clinic. Normal device function. Thresholds, sensing, impedances consistent with previous measurements. Device programmed to maximize longevity. No mode switch or high ventricular rates noted. Device programmed at appropriate safety margins. Histogram distribution appropriate for patient activity level. Device programmed to optimize intrinsic conduction. Estimated longevity 9-15 months. Patient is scheduled to return in 3 months to device clinic for battery check (03/30/20 at 4:00). Error in saving attachment

## 2021-02-26 ENCOUNTER — Other Ambulatory Visit: Payer: Self-pay | Admitting: Internal Medicine

## 2021-03-30 ENCOUNTER — Other Ambulatory Visit: Payer: Self-pay

## 2021-03-30 ENCOUNTER — Ambulatory Visit: Payer: Managed Care, Other (non HMO)

## 2021-03-30 ENCOUNTER — Ambulatory Visit: Payer: Managed Care, Other (non HMO) | Admitting: Neurology

## 2021-03-30 DIAGNOSIS — Q246 Congenital heart block: Secondary | ICD-10-CM

## 2021-03-30 NOTE — Progress Notes (Signed)
Pacemaker check in clinic for battery check.  Estimated time remaining until ERI 6-9 months.   Limited check completed to preserve battery life; No arrythmia episodes logged.

## 2021-04-03 ENCOUNTER — Encounter: Payer: Self-pay | Admitting: Neurology

## 2021-04-03 ENCOUNTER — Ambulatory Visit: Payer: Managed Care, Other (non HMO) | Admitting: Neurology

## 2021-04-03 VITALS — BP 150/75 | HR 70 | Ht 63.0 in | Wt 197.0 lb

## 2021-04-03 DIAGNOSIS — R2689 Other abnormalities of gait and mobility: Secondary | ICD-10-CM

## 2021-04-03 DIAGNOSIS — R269 Unspecified abnormalities of gait and mobility: Secondary | ICD-10-CM | POA: Diagnosis not present

## 2021-04-03 DIAGNOSIS — Z9181 History of falling: Secondary | ICD-10-CM

## 2021-04-03 DIAGNOSIS — G20C Parkinsonism, unspecified: Secondary | ICD-10-CM

## 2021-04-03 DIAGNOSIS — R29898 Other symptoms and signs involving the musculoskeletal system: Secondary | ICD-10-CM

## 2021-04-03 DIAGNOSIS — G20 Parkinson's disease: Secondary | ICD-10-CM

## 2021-04-03 DIAGNOSIS — R29818 Other symptoms and signs involving the nervous system: Secondary | ICD-10-CM

## 2021-04-03 NOTE — Progress Notes (Signed)
Subjective:    Patient ID: [name] is a 61 y.o. female.  HPI    Star Age, MD, PhD [facility] [address], [address] [place], [place] [address]  Dear [name],   I saw your patient, [name], upon your kind request in my neurologic clinic today for initial consultation of her balance problem and falls.  The patient is unaccompanied today.  As you know, Ms. Umfress is a 61 year old right-handed woman with an underlying medical history of hypothyroidism, heart block with status post pacemaker placement, history of endocarditis, arthritis, vitamin D deficiency, chronic kidney disease, hypertension, hyperlipidemia, and obesity, who reports a 1+ year history of intermittent hand tremors, balance issues, recurrent falls, fine motor dyscontrol, changes in her gait.  She reports that her dad had Parkinson's disease.  She has noticed her symptoms essentially after she had COVID in September 2021.  She has not tried any medication for her tremor.  She has fallen backwards in her yard, but most of her falls have been accidental where she tripped over something on uneven ground.  She fell in 2018 and hit her forehead but did not lose consciousness, she had a laceration but repaired it herself.  She did not seek medical attention at the time.  She was vacationing at the time. I reviewed your office note from 12/19/2020. She had her first pacemaker when she was about 61 years old.  She lives with her husband, she has a history of left eye amblyopia and recently received new glasses.  She has no children.  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  She drinks caffeine, about 2-3 servings per day, rare alcohol, she is a non-smoker.  Her tremor affects both hands but seems to be worse on the left, it comes and goes, sometimes she feels that she can suppress it.  She does have a tendency to bother her feet when nervous or just by habit.  She feels that  she is weaker in her muscles, does admit to not being very active and probably having some deconditioning.  Her Past Medical History Is Significant For: Past Medical History:  Diagnosis Date   Asthma    Congenital complete AV block 1978   Hypertension    Hypothyroidism    Presence of permanent cardiac pacemaker    PVC (premature ventricular contraction)     Her Past Surgical History Is Significant For: Past Surgical History:  Procedure Laterality Date   INSERT / REPLACE / REMOVE PACEMAKER     s/p PPM implant 1978 for congenital CHB    PERMANENT PACEMAKER GENERATOR CHANGE N/A 04/25/2012   Procedure: PERMANENT PACEMAKER GENERATOR CHANGE;  Surgeon: Evans Lance, MD;  Location: [facility] CATH LAB;  Service: Cardiovascular;  Laterality: N/A;    Her Family History Is Significant For: Family History  Problem Relation Age of Onset   Heart disease Mother    COPD Mother    Hypothyroidism Father        she knows minimal about her father/his family   Parkinson's disease Father    Breast cancer Maternal Aunt    Heart disease Maternal Grandfather     Her Social History Is Significant For: Social History   Socioeconomic History   Marital status: Married    Spouse name: Not on file   Number of children: Not on file   Years of education: Not on file   Highest education level: Not on file  Occupational  History   Not on file  Tobacco Use   Smoking status: Never   Smokeless tobacco: Never  Vaping Use   Vaping Use: Never used  Substance and Sexual Activity   Alcohol use: No   Drug use: No   Sexual activity: Not on file  Other Topics Concern   Not on file  Social History Narrative   Not on file   Social Determinants of Health   Financial Resource Strain: Not on file  Food Insecurity: Not on file  Transportation Needs: Not on file  Physical Activity: Not on file  Stress: Not on file  Social Connections: Not on file    Her Allergies Are:  Allergies  Allergen Reactions    Azithromycin Rash   Doxycycline Rash   Erythromycin Nausea And Vomiting   Penicillins Rash   Sulfamethoxazole-Trimethoprim Rash  :   Her Current Medications Are:  Outpatient Encounter Medications as of 04/03/2021  Medication Sig   aspirin EC 81 MG tablet Take 81 mg by mouth daily.   calcium carbonate (TUMS - DOSED IN MG ELEMENTAL CALCIUM) 500 MG chewable tablet Chew 1 tablet by mouth as needed (Take as directed).    cephALEXin (KEFLEX) 500 MG capsule TAKE 4 CAPSULES 1 HOUR PRIOR TO PROCEDURE.   cyclobenzaprine (FLEXERIL) 10 MG tablet Take 1 tablet by mouth 3 (three) times daily as needed for spasms.   diphenhydrAMINE (BENADRYL) 25 mg capsule Take 25 mg by mouth every 6 (six) hours as needed for itching or allergies.   diphenhydrAMINE (SOMINEX) 25 MG tablet Take 25 mg by mouth at bedtime as needed for sleep.   furosemide (LASIX) 20 MG tablet Take 20 mg by mouth daily.   hydrocortisone 1 % lotion Apply 1 application topically 2 (two) times daily.   ibuprofen (ADVIL,MOTRIN) 200 MG tablet Take 200 mg by mouth every 6 (six) hours as needed for pain.   levothyroxine (SYNTHROID, LEVOTHROID) 88 MCG tablet Take 88 mcg by mouth at bedtime.    meloxicam (MOBIC) 15 MG tablet Take 1 tablet by mouth daily as needed for pain.   metoprolol (LOPRESSOR) 100 MG tablet Take 50 mg by mouth daily.   pregabalin (LYRICA) 25 MG capsule Take 25 mg by mouth 2 (two) times daily.    RA Vitamin E-Vit A & D 20000 units CREA    trolamine salicylate (ASPERCREME) 10 % cream Apply 1 application topically as needed for muscle pain.   Trolamine Salicylate (ASPERCREME) 10 % LOTN    No facility-administered encounter medications on file as of 04/03/2021.  :   Review of Systems:  Out of a complete 14 point review of systems, all are reviewed and negative with the exception of these symptoms as listed below:  Review of Systems  Neurological:        Pt is here for tremors . Pt states tremors are in arms and hands Pt states  she has fallen several times in the last 2 years .    Objective:  Neurological Exam  Physical Exam Physical Examination:   Vitals:   04/03/21 1503  BP: (!) 150/75  Pulse: 70    General Examination: The patient is a very pleasant 61 y.o. female in no acute distress. She appears well-developed and well-nourished and well groomed.   HEENT: Normocephalic, atraumatic, pupils are equal, round and reactive to light, right eye, corrective eyeglasses in place, mild impairment of extraocular tracking with all directions, no nystagmus.  Hearing grossly intact, face is symmetric with mild facial masking,  mild nuchal rigidity, no carotid bruits, airway examination reveals mild mouth dryness, tongue protrudes centrally and palate elevates symmetrically.   Chest: Clear to auscultation without wheezing, rhonchi or crackles noted.  Heart: S1+S2+0, regular and normal without murmurs, rubs or gallops noted.   Abdomen: Soft, non-tender and non-distended.  Extremities: There is no pitting edema in the distal lower extremities bilaterally.   Skin: Warm and dry without trophic changes noted.   Musculoskeletal: exam reveals no obvious joint deformities, tenderness or joint swelling or erythema.   Neurologically:  Mental status: The patient is awake, alert and oriented in all 4 spheres. Her immediate and remote memory, attention, language skills and fund of knowledge are appropriate. There is no evidence of aphasia, agnosia, apraxia or anomia. Speech is clear with normal prosody and enunciation. Thought process is linear. Mood is normal and affect is normal.  Cranial nerves II - XII are as described above under HEENT exam.  Motor exam: Normal bulk, strength and tone is noted, perhaps slight increase in tone in the left upper extremity, no obvious resting tremor but she does have a slight postural tremor in both upper extremities.  Romberg is not tested for safety concerns, reflexes are 1-2+.   Fine motor  skills and coordination: She has mild difficulty with finger taps and slight slowness with hand movements and mild insecurity with rapid alternating patting, slightly worse on the left but no telltale lateralization.  Foot taps are minimally impaired bilaterally, no obvious lateralization noted.  Foot agility fairly normal bilaterally. Cerebellar testing: No dysmetria or intention tremor. There is no truncal or gait ataxia.  Heel-to-shin is difficult for her but no dysmetria noted.  Finger-to-nose shows no obvious dysmetria. Sensory exam: intact to light touch in the upper and lower extremities.  Gait, station and balance: She stands with minimal help.  She stands with a slightly stooped posture for age, she walks with decreased stride length and decreased pace, no shuffling, decreased arm swing bilaterally with almost dystonic posturing of both upper extremities while walking.  She turns without difficulty, no walking aids.  Assessment and Plan:  In summary, ALEXIEA ROSSETTI is a very pleasant 61 y.o.-year old female with an underlying medical history of hypothyroidism, heart block with status post pacemaker placement, history of endocarditis, arthritis, vitamin D deficiency, chronic kidney disease, hypertension, hyperlipidemia, and obesity, who presents for evaluation of her tremor disorder, she has had balance issues and recurrent falls.  She does report additional symptoms that are nonspecific such as global weakness, deconditioning.  Examination is not telltale for idiopathic Parkinson's disease but she does have fine motor dyscontrol and changes in gait.  She has mild nuchal rigidity and mild facial masking as well as mild difficulty tracking with her eyes.  No obvious lateralization on my examination, while she does not have a history and exam classic for idiopathic Parkinson's disease, it is not impossible that she has a form of atypical parkinsonism.  She cannot have an MRI due to her having a  pacemaker.  I explained to her that I am not quite sure how to tie in all her symptoms and exam findings but we could pursue a special brain scan called a DaTscan which is a nuclear medicine SPECT scan that looks at the uptake of a certain marker which can be a useful supportive tool for parkinsonian syndromes.  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  Her findings are on the milder side and overall quite subtle at times.  I am not quite sure as to the underlying etiology as yet.  She is agreeable to pursuing a DaTscan and we talked about this.  She is advised to proceed with a CMP to update her kidney function.  We will call her with her DaTscan results and plan to follow-up in this clinic accordingly.  We talked about the importance of maintaining a healthy lifestyle and maintaining an exercise routine.  She is encouraged to try it exercise and build up muscle strength, she is not frankly weak today but may be somewhat deconditioned. I answered all her questions today and she was in agreement with our approach.  Thank you very much for allowing me to participate in the care of this nice patient. If I can be of any further assistance to you please do not hesitate to call me at [phone].  Sincerely,   Star Age, MD, PhD

## 2021-04-03 NOTE — Patient Instructions (Addendum)
It was nice to meet you today.  I am not sure how to explain all your symptoms but you do have difficulty with your fine motor skills and walking as well as intermittent tremors and a family history of Parkinson's.  You cannot have an MRI due to your pacemaker.  I would like to do a so called DaT scan: This is a specialized brain scan designed to help with diagnosis of tremor disorders. A radioactive marker gets injected and the uptake is measured in the brain and compared to normal controls and right side is compared to the left, a change in uptake can help with diagnosis of certain tremor disorders. A brain MRI on the other hand is a brain scan that helps look at the brain structure in more detail overall and look for age-related changes, blood vessel related changes and look for stroke and volume loss which we call atrophy.  We will call you with the results and plan a follow up after the scan.

## 2021-04-04 LAB — COMPREHENSIVE METABOLIC PANEL
ALT: 12 IU/L (ref 0–32)
AST: 17 IU/L (ref 0–40)
Albumin/Globulin Ratio: 1.8 (ref 1.2–2.2)
Albumin: 4.4 g/dL (ref 3.8–4.9)
Alkaline Phosphatase: 104 IU/L (ref 44–121)
BUN/Creatinine Ratio: 13 (ref 12–28)
BUN: 12 mg/dL (ref 8–27)
Bilirubin Total: 0.4 mg/dL (ref 0.0–1.2)
CO2: 25 mmol/L (ref 20–29)
Calcium: 9.3 mg/dL (ref 8.7–10.3)
Chloride: 102 mmol/L (ref 96–106)
Creatinine, Ser: 0.95 mg/dL (ref 0.57–1.00)
Globulin, Total: 2.5 g/dL (ref 1.5–4.5)
Glucose: 74 mg/dL (ref 70–99)
Potassium: 4.5 mmol/L (ref 3.5–5.2)
Sodium: 141 mmol/L (ref 134–144)
Total Protein: 6.9 g/dL (ref 6.0–8.5)
eGFR: 69 mL/min/{1.73_m2} (ref >59)

## 2021-04-06 ENCOUNTER — Telehealth: Payer: Self-pay | Admitting: Neurology

## 2021-04-06 NOTE — Telephone Encounter (Signed)
[employer] no Chestine Spore ref # [id number] spoke to [place]. Order sent to Nuclear Medicine, they will reach out to the patient to schedule.

## 2021-04-19 ENCOUNTER — Other Ambulatory Visit: Payer: Self-pay

## 2021-04-19 ENCOUNTER — Ambulatory Visit (HOSPITAL_COMMUNITY)
Admission: RE | Admit: 2021-04-19 | Discharge: 2021-04-19 | Disposition: A | Discharge status: Home / Self Care | Payer: Managed Care, Other (non HMO) | Source: Ambulatory Visit | Attending: Neurology | Admitting: Neurology

## 2021-04-19 ENCOUNTER — Encounter (HOSPITAL_COMMUNITY)
Admission: RE | Admit: 2021-04-19 | Discharge: 2021-04-19 | Disposition: A | Discharge status: Home / Self Care | Payer: Managed Care, Other (non HMO) | Source: Ambulatory Visit | Attending: Neurology | Admitting: Neurology

## 2021-04-19 DIAGNOSIS — G20C Parkinsonism, unspecified: Secondary | ICD-10-CM

## 2021-04-19 DIAGNOSIS — R29818 Other symptoms and signs involving the nervous system: Secondary | ICD-10-CM

## 2021-04-19 DIAGNOSIS — G20 Parkinson's disease: Secondary | ICD-10-CM

## 2021-04-19 DIAGNOSIS — R29898 Other symptoms and signs involving the musculoskeletal system: Secondary | ICD-10-CM | POA: Diagnosis present

## 2021-04-19 DIAGNOSIS — R269 Unspecified abnormalities of gait and mobility: Secondary | ICD-10-CM | POA: Insufficient documentation

## 2021-04-19 DIAGNOSIS — Z9181 History of falling: Secondary | ICD-10-CM

## 2021-04-19 DIAGNOSIS — R2689 Other abnormalities of gait and mobility: Secondary | ICD-10-CM | POA: Diagnosis present

## 2021-04-19 MED ORDER — POTASSIUM IODIDE (ANTIDOTE) 130 MG PO TABS
ORAL_TABLET | ORAL | Status: AC
  Administered 2021-04-19: 130 mg via ORAL
  Filled 2021-04-19: qty 1

## 2021-04-19 MED ORDER — POTASSIUM IODIDE (ANTIDOTE) 130 MG PO TABS: 130.0000 mg | ORAL_TABLET | Freq: Once | ORAL | Status: DC

## 2021-04-19 MED ORDER — IOFLUPANE I 123 185 MBQ/2.5ML IV SOLN
4.8000 | Freq: Once | INTRAVENOUS | Status: AC | PRN
  Administered 2021-04-19: 4.8 via INTRAVENOUS
  Filled 2021-04-19: qty 5

## 2021-04-25 ENCOUNTER — Telehealth: Payer: Self-pay | Admitting: *Deleted

## 2021-04-25 NOTE — Telephone Encounter (Signed)
-----   Message from Huston Foley, MD sent at 04/20/2021  4:39 PM EST ----- ?Please call patient regarding the recent nuclear medicine DaT scan result. As discussed, this is a specialized brain scan designed to help with diagnosis of tremor disorders, including parkinsonian disorders. A radioactive marker gets injected and the uptake is measured in the brain and compared to normal controls and right side is compared to the left. A change in uptake can help with diagnosis of certain tremor disorders and narrow down the diagnostic possibilities. 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 Again, this is not a definitive test for Parkinson's disease and does not distinguish between Parkinson's disease and other, atypical forms of parkinsonism disorders. ?Please offer FU appt, and we can think about symptomatic treatment options. ? ?

## 2021-04-25 NOTE — Telephone Encounter (Signed)
Spoke with the patient and discussed her DaTscan results.  Patient understands the scan was abnormal which indicates an underlying parkinsonian disorder.  Patient aware this is not a definitive test for Parkinson's disease but rather distinguishes between essential tremor and parkinsonian.  Patient's questions were answered.  She scheduled an appointment for next available on May 3 at 1:15 PM.  I had offered one on April 5 but it was too early in the morning.  Patient was placed on a wait list for possible sooner appointment.  She verbalized appreciation for the call. ?

## 2021-05-18 ENCOUNTER — Encounter: Payer: Self-pay | Admitting: Neurology

## 2021-05-18 ENCOUNTER — Telehealth: Payer: Self-pay | Admitting: *Deleted

## 2021-05-18 ENCOUNTER — Ambulatory Visit: Payer: Managed Care, Other (non HMO) | Admitting: Neurology

## 2021-05-18 VITALS — BP 125/72 | HR 69 | Ht 66.0 in | Wt 195.8 lb

## 2021-05-18 DIAGNOSIS — G20 Parkinson's disease: Secondary | ICD-10-CM

## 2021-05-18 MED ORDER — CARBIDOPA-LEVODOPA 25-100 MG PO TABS: 1.0000 | ORAL_TABLET | Freq: Three times a day (TID) | ORAL | 3 refills | Status: DC

## 2021-05-18 NOTE — Patient Instructions (Addendum)
It was nice to see you again today.  ?As discussed, we will start you on a trial of Sinemet (generic name: carbidopa-levodopa) 25/100 mg: Take half a pill twice daily (8 AM and noon) for one week, then half a pill 3 times a day (8 AM, noon, and 4 PM) for one week, then one pill 3 times a day thereafter. Please try to take the medication away from you mealtimes, that is, ideally either one hour before or 2 hours after your meal to ensure optimal absorption. The medication can interfere with the protein content of your meal and trying to the protein in your food and therefore not get fully absorbed.  ?Common side effects reported are: Nausea, vomiting, sedation, confusion, lightheadedness. Rare side effects include hallucinations, severe nausea or vomiting, diarrhea and significant drop in blood pressure especially when going from lying to standing or from sitting to standing.  ?Please follow up in 3-4 months to see one of our nurse practitioners, you can call us or email [place] through MyChart for interim questions or concerns. ?

## 2021-05-18 NOTE — Telephone Encounter (Signed)
Mychart message sent about her trip in June.  ?

## 2021-05-18 NOTE — Progress Notes (Signed)
Subjective:  ?  ?Patient ID: [name] is a 61 y.o. female. ? ?HPI ? ? ? ?Interim history:  ? 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  The patient is unaccompanied today.  I first met her at the request of her primary care nurse practitioner on 04/03/2021, at which time she reported a 1+ year history of tremors, also difficulty with her balance and falls.  She has had fine motor dyscontrol.  She had evidence of parkinsonian changes, overall no telltale lateralization and findings not necessarily supportive of idiopathic Parkinson's disease, atypical parkinsonism was a possibility.  She was advised to proceed with a DaTscan.  She had a nuclear medicine brain DaTscan on 04/19/2021 and I reviewed the results: IMPRESSION: ?Significant decreased activity within the bilateral putamen is a ?pattern associated with Parkinsonian syndrome pathology. ?  ?Of note, DaTSCAN is not diagnostic of Parkinsonian syndromes, which ?remains a clinical diagnosis. DaTscan is an adjuvant test to aid in ?the clinical diagnosis of Parkinsonian syndromes. ?  ?She was notified of the test results by phone call. ? ?Today, 05/18/2021: She reports feeling about the same.  She has read up on Parkinsonian syndromes.  She also read her DaTscan report.  She reports that her dad had Parkinson's disease.  He was not labeled as atypical she she currently does not take her Lyrica.  She takes Flexeril only sparingly when needed for back pain.  She has not had any recent falls.  She would be willing to start levodopa therapy.  She recalls that her handwriting changed some 3 to 4 years ago even.  She frequently basis, she is supposed  to have a pacemaker changed in September of this year. ? ?The patient's allergies, current medications, family history, past medical history, past social history, past surgical history and problem list were reviewed and updated as appropriate.  ? ?Previously:  ? ?04/03/21: (She) reports a 1+ year history of intermittent hand tremors, balance issues, recurrent falls, fine motor dyscontrol, changes in her gait.  She reports that her dad had Parkinson's disease.  She has noticed her symptoms essentially after she had COVID in September 2021.  She has not tried any medication for her tremor.  She has fallen backwards in her yard, but most of her falls have been accidental where she tripped over something on uneven ground.  She fell in 2018 and hit her forehead but did not lose consciousness, she had a laceration but repaired it herself.  She did not seek medical attention at the time.  She was vacationing at the time. ?I reviewed your office note from 12/19/2020. ?She had her first pacemaker when she was about 61 years old.  She lives with her husband, she has a history of left eye amblyopia and recently received new glasses.  She has no children.  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  She drinks caffeine, about 2-3 servings per day, rare alcohol, she is a non-smoker.  Her tremor affects both hands but seems to be worse on the left, it comes and goes, sometimes she feels that she can suppress it.  She does have a tendency to bother her feet when nervous or just by habit.  She feels that she is weaker  in her muscles, does admit to not being very active and probably having some deconditioning. ? ?Her Past Medical History Is Significant For: ?Past Medical History:  ?Diagnosis Date  ? Asthma   ? Congenital complete AV block 1978  ? Hypertension   ? Hypothyroidism   ? Presence of permanent cardiac pacemaker   ? PVC (premature ventricular contraction)   ? ? ?Her Past Surgical History Is Significant  For: ?Past Surgical History:  ?Procedure Laterality Date  ? INSERT / REPLACE / REMOVE PACEMAKER    ? s/p PPM implant 1978 for congenital CHB   ? PERMANENT PACEMAKER GENERATOR CHANGE N/A 04/25/2012  ? Procedure: PERMANENT PACEMAKER GENERATOR CHANGE;  Surgeon: Evans Lance, MD;  Location: [facility] CATH LAB;  Service: Cardiovascular;  Laterality: N/A;  ? ? ?Her Family History Is Significant For: ?Family History  ?Problem Relation Age of Onset  ? Heart disease Mother   ? COPD Mother   ? Hypothyroidism Father   ?     she knows minimal about her father/his family  ? Parkinson's disease Father   ? Breast cancer Maternal Aunt   ? Heart disease Maternal Grandfather   ? ? ?Her Social History Is Significant For: ?Social History  ? ?Socioeconomic History  ? Marital status: Married  ?  Spouse name: Not on file  ? Number of children: Not on file  ? Years of education: Not on file  ? Highest education level: Not on file  ?Occupational History  ? Not on file  ?Tobacco Use  ? Smoking status: Never  ? Smokeless tobacco: Never  ?Vaping Use  ? Vaping Use: Never used  ?Substance and Sexual Activity  ? Alcohol use: No  ? Drug use: No  ? Sexual activity: Not on file  ?Other Topics Concern  ? Not on file  ?Social History Narrative  ? Not on file  ? ?Social Determinants of Health  ? ?Financial Resource Strain: Not on file  ?Food Insecurity: Not on file  ?Transportation Needs: Not on file  ?Physical Activity: Not on file  ?Stress: Not on file  ?Social Connections: Not on file  ? ? ?Her Allergies Are:  ?Allergies  ?Allergen Reactions  ? Azithromycin Rash  ? Doxycycline Rash  ? Erythromycin Nausea And Vomiting  ? Penicillins Rash  ? Sulfamethoxazole-Trimethoprim Rash  ?:  ? ?Her Current Medications Are:  ?Outpatient Encounter Medications as of 05/18/2021  ?Medication Sig  ? aspirin EC 81 MG tablet Take 81 mg by mouth daily.  ? calcium carbonate (TUMS - DOSED IN MG ELEMENTAL CALCIUM) 500 MG chewable tablet Chew 1 tablet by mouth as needed (Take as  directed).   ? cephALEXin (KEFLEX) 500 MG capsule TAKE 4 CAPSULES 1 HOUR PRIOR TO PROCEDURE.  ? cyclobenzaprine (FLEXERIL) 10 MG tablet Take 1 tablet by mouth 3 (three) times daily as needed for spasms.  ? diphenhydrAMINE (BENADRYL) 25 mg capsule Take 25 mg by mouth every 6 (six) hours as needed for itching or allergies.  ? diphenhydrAMINE (SOMINEX) 25 MG tablet Take 25 mg by mouth at bedtime as needed for sleep.  ? furosemide (LASIX) 20 MG tablet Take 20 mg by mouth daily.  ? hydrocortisone 1 % lotion Apply 1 application topically 2 (two) times daily.  ? ibuprofen (ADVIL,MOTRIN) 200 MG tablet Take 200 mg by mouth every 6 (six) hours as needed for pain.  ? levothyroxine (SYNTHROID, LEVOTHROID) 88 MCG tablet Take 88 mcg by mouth at bedtime.   ? meloxicam (MOBIC) 15 MG tablet  Take 1 tablet by mouth daily as needed for pain.  ? metoprolol (LOPRESSOR) 100 MG tablet Take 50 mg by mouth daily.  ? pregabalin (LYRICA) 25 MG capsule Take 25 mg by mouth 2 (two) times daily.   ? RA Vitamin E-Vit A & D 20000 units CREA   ? trolamine salicylate (ASPERCREME) 10 % cream Apply 1 application topically as needed for muscle pain.  ? Trolamine Salicylate (ASPERCREME) 10 % LOTN   ? ?No facility-administered encounter medications on file as of 05/18/2021.  ?: ? ?Review of Systems:  ?Out of a complete 14 point review of systems, all are reviewed and negative with the exception of these symptoms as listed below: ? ?Review of Systems  ?Neurological:   ?     Pt is here for follow up from [facility]   ? ?Objective:  ?Neurological Exam ? ?Physical Exam ?Physical Examination:  ? ?Vitals:  ? 05/18/21 1416  ?BP: 125/72  ?Pulse: 69  ? ? ?General Examination: The patient is a very pleasant 61 y.o. female in no acute distress. She appears well-developed and well-nourished and well groomed.  ? ?HEENT: Normocephalic, atraumatic, pupils are equal, round and reactive to light, tracking is mildly impaired, mild limitation to upgaze bilaterally.  Mild facial  masking, mild nuchal rigidity, corrective eyeglasses in place, hearing is grossly intact.  No carotid bruits, no significant mouth dryness, tongue protrudes centrally and palate elevates symmetrically.  Good ton

## 2021-06-14 ENCOUNTER — Ambulatory Visit: Payer: Managed Care, Other (non HMO) | Admitting: Neurology

## 2021-06-28 ENCOUNTER — Ambulatory Visit (INDEPENDENT_AMBULATORY_CARE_PROVIDER_SITE_OTHER): Payer: Managed Care, Other (non HMO)

## 2021-06-28 DIAGNOSIS — Q246 Congenital heart block: Secondary | ICD-10-CM | POA: Diagnosis not present

## 2021-06-28 LAB — CUP PACEART INCLINIC DEVICE CHECK
Battery Impedance: 16300 Ohm
Battery Remaining Longevity: 3 mo
Battery Voltage: 2.59 V
Brady Statistic RV Percent Paced: 99 %
Date Time Interrogation Session: 20230517152400
Implantable Lead Implant Date: 20070629
Implantable Lead Implant Date: 20070629
Implantable Lead Location: 753859
Implantable Lead Location: 753860
Implantable Lead Model: 511212
Implantable Lead Model: 511212
Implantable Lead Serial Number: 109900
Implantable Lead Serial Number: 109901
Implantable Pulse Generator Implant Date: 20140314
Lead Channel Impedance Value: 321 Ohm
Lead Channel Impedance Value: 380 Ohm
Lead Channel Pacing Threshold Amplitude: 0.75 V
Lead Channel Pacing Threshold Amplitude: 1 V
Lead Channel Pacing Threshold Pulse Width: 0.4 ms
Lead Channel Pacing Threshold Pulse Width: 0.5 ms
Lead Channel Setting Pacing Amplitude: 2 V
Lead Channel Setting Pacing Amplitude: 2.5 V
Lead Channel Setting Pacing Pulse Width: 0.5 ms
Lead Channel Setting Sensing Sensitivity: 6 mV
Pulse Gen Model: 5826
Pulse Gen Serial Number: 7424521

## 2021-06-28 NOTE — Progress Notes (Signed)
Pacemaker check in clinic for battery check. Normal device function. Thresholds, sensing, impedances consistent with previous measurements. Device programmed to maximize longevity. No high ventricular rates noted. Device programmed at appropriate safety margins. Histogram distribution appropriate for patient activity level. Device programmed to optimize intrinsic conduction. Estimated longevity 3 months. In clinic battery check scheduled for 08/02/21 at 11:20. Error in saving attachment. ?

## 2021-08-02 ENCOUNTER — Ambulatory Visit (INDEPENDENT_AMBULATORY_CARE_PROVIDER_SITE_OTHER): Payer: Managed Care, Other (non HMO)

## 2021-08-02 DIAGNOSIS — Q246 Congenital heart block: Secondary | ICD-10-CM

## 2021-08-02 LAB — CUP PACEART INCLINIC DEVICE CHECK
Date Time Interrogation Session: 20230621114352
Implantable Lead Implant Date: 20070629
Implantable Lead Implant Date: 20070629
Implantable Lead Location: 753859
Implantable Lead Location: 753860
Implantable Lead Model: 511212
Implantable Lead Model: 511212
Implantable Lead Serial Number: 109900
Implantable Lead Serial Number: 109901
Implantable Pulse Generator Implant Date: 20140314
Pulse Gen Model: 5826
Pulse Gen Serial Number: 7424521

## 2021-08-02 NOTE — Progress Notes (Signed)
Monthly battery check completed. Battery estimates 0.25 months left. Voltage is currently 2.54 ~ ERI is 2.5V. Next monthly battery check 09/06/21.   Error in saving report.

## 2021-08-21 ENCOUNTER — Other Ambulatory Visit: Payer: Self-pay | Admitting: Internal Medicine

## 2021-09-06 ENCOUNTER — Telehealth: Payer: Self-pay

## 2021-09-06 ENCOUNTER — Ambulatory Visit (INDEPENDENT_AMBULATORY_CARE_PROVIDER_SITE_OTHER): Payer: Managed Care, Other (non HMO)

## 2021-09-06 NOTE — Telephone Encounter (Signed)
Patient seen today in device clinic for monthly battery check.   Device has reached ERI as of JUNE 28TH, 2023.  Patient is one month in ERI already.   Please reach out to patient to schedule with Dr. [name] to discuss gen change.

## 2021-09-06 NOTE — Progress Notes (Signed)
Patient seen in device clinic for monthly battery check.  Device has reached ERI on June 28th, 2023.  Error in saving report, see scanned media for detailed report.

## 2021-09-13 ENCOUNTER — Telehealth: Payer: Self-pay | Admitting: Internal Medicine

## 2021-09-13 NOTE — Telephone Encounter (Signed)
Pt is calling to inform Boneta Lucks RN that she will have to get her gen change done no later than this month.  Pt states they were initially going to schedule her gen change for sometime in Sept, but unfortunately she lost her job today and will only have insurance coverage through the end of this month.  Pt is scheduled to see Francis Dowse PA-C for next Monday to discuss gen change at that time.   Pt aware that Dierdre Highman is out of the office today, but I will forward this matter to her, to address with the pt upon return to the office.   Advised the pt to keep her scheduled appt with Renee on 8/7, and hopefully they will be able to get this worked out for her.   Pt verbalized understanding and and agrees with this plan.

## 2021-09-13 NOTE — Telephone Encounter (Signed)
Patient needs to schedule her pacemaker replacement as soon as possible in August as her insurance will expire at the end of August.  Patient stated you can reach her on her home phone if no service on her mobile.

## 2021-09-14 NOTE — Telephone Encounter (Signed)
Pt scheduled for gen change on October 06, 2021 at 3:30 pm.  See instruction letter.  Attempted to contact Pt to advise of gen change date.  Advised to KEEP her appt with RU scheduled for 09/18/21 to get labs/instruction letter/soap.  Work up for gen change is complete.

## 2021-09-15 NOTE — Progress Notes (Signed)
Cardiology Office Note Date:  09/15/2021  Patient ID:  Veronica, Becker [birth year], MRN [id number] PCP:  Linus Galas, NP  EP:  Dr. [name]    Chief Complaint: ERI  History of Present Illness: [name] is a 61 y.o. female with history of HTN, hypothyroidism, asthma, congential CHB, Afib.  April 2020 she was found with a marked increase in Afib burden, with 40 days of AF, Dr. [name] recommended via phone notes starting Eliquis, she was reluctant given meloxicam, recommended reduction in dose and use of meloxicam and starting Eliquis, patient remained leary, planned for virtual visit   She saw Dr. [name] May 2020 via tele health, she had been found with AFib (she thought triggered by stress at work), recommended a/c, though she declined.  Planned for annual visit.  She last saw Dr. [name] 09/23/20, some edema suspected 2/2 being on her feet for prolonged periods and diet. No changes were made.  TODAY She feels well Unfortunately has been terminated from her job, but otherwise, doing OK. She c/w baseline tremor No CP, palpitations No SOB No near syncope or syncope.  She mentions her antibiotic regime for her pacer procedures has been vancomycin with IV benadryl.  Will have some itching but does OK   Device information SJM dual chamber PPM, gen change 2014 (L abdominal implant) Her leads are epicardial from 2007   08/10/2005 OP report (Dr. Laneta Simmers) Median sternotomy, extracorporeal circulation, removal  of infected permanent pacemaker system via the right atrium.  Implantation  of new epicardial dual-chamber permanent pacing system  08/22/2001: OP report (Dr. Aleen Campi) her first pacemaker at age 58 at the [facility] [place]  This was a VVI pacemaker with a large pulse generator and this pulse generator later migrated through her right breast and extruded underneath her right breast on the lower aspect of the pulse generator.  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 Orson Slick, general surgeon, and replaced it with a smaller unit, higher in her right chest.  She did well with that unit still being a VVI pacer from 1983 until 1990 when that pulse generator had battery failure.  Her third pacemaker then was implanted in 1990 at [facility] at which time we upgraded the system to a dual chamber pacemaker by inserting a new atrial lead into the right subclavian vein.  She states that we encountered marked difficulty in inserting the new atrial lead in 1990.  This was a unipolar lead and she continued to have a unipolar system.  She now returns 13 years later with battery depletion showing signs of expected replacement time of the pulse Generator... 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  She had good collaterals around this section.  We then discussed alternatives again with the patient and informed her that we were going to be unable to put in a new atrial lead in the right side and made a recommendation to put in a new bipolar system in the left chest using the left subclavian vein.  However, she refused to consider this option.  We felt that this was her best longterm alternative.  After she refused this alternative, we offered to cap off the atrial channel and the new pulse generator which was on the table and continue pacing her in a VVI mode.  She felt that this was the  least invasive of all alternatives and wanted [place] to proceed with this alternative. We then proceeded to cap off the atrial channel on the new pulse generator and continued to pace in a VVI mode  1st device was Right abdomen Extracted   Past Medical History:  Diagnosis Date   Asthma    Congenital complete AV block 1978   Hypertension    Hypothyroidism    Presence of permanent cardiac pacemaker    PVC (premature ventricular contraction)      Past Surgical History:  Procedure Laterality Date   INSERT / REPLACE / REMOVE PACEMAKER     s/p PPM implant 1978 for congenital CHB    PERMANENT PACEMAKER GENERATOR CHANGE N/A 04/25/2012   Procedure: PERMANENT PACEMAKER GENERATOR CHANGE;  Surgeon: Evans Lance, MD;  Location: [facility] CATH LAB;  Service: Cardiovascular;  Laterality: N/A;    Current Outpatient Medications  Medication Sig Dispense Refill   aspirin EC 81 MG tablet Take 81 mg by mouth daily.     calcium carbonate (TUMS - DOSED IN MG ELEMENTAL CALCIUM) 500 MG chewable tablet Chew 1 tablet by mouth as needed (Take as directed).      carbidopa-levodopa (SINEMET IR) 25-100 MG tablet Take 1 tablet by mouth 3 (three) times daily. Follow titration instructions provided. 90 tablet 3   cephALEXin (KEFLEX) 500 MG capsule TAKE 4 CAPSULES 1 HOUR PRIOR TO PROCEDURE. 4 capsule 2   cyclobenzaprine (FLEXERIL) 10 MG tablet Take 1 tablet by mouth 3 (three) times daily as needed for spasms.     diphenhydrAMINE (BENADRYL) 25 mg capsule Take 25 mg by mouth every 6 (six) hours as needed for itching or allergies.     diphenhydrAMINE (SOMINEX) 25 MG tablet Take 25 mg by mouth at bedtime as needed for sleep.     furosemide (LASIX) 20 MG tablet Take 20 mg by mouth daily.     hydrocortisone 1 % lotion Apply 1 application topically 2 (two) times daily. 118 mL 0   ibuprofen (ADVIL,MOTRIN) 200 MG tablet Take 200 mg by mouth every 6 (six) hours as needed for pain.     levothyroxine (SYNTHROID, LEVOTHROID) 88 MCG tablet Take 88 mcg by mouth at bedtime.      meloxicam (MOBIC) 15 MG tablet Take 1 tablet by mouth daily as needed for pain.     metoprolol (LOPRESSOR) 100 MG tablet Take 50 mg by mouth daily.     pregabalin (LYRICA) 25 MG capsule Take 25 mg by mouth 2 (two) times daily.      RA Vitamin E-Vit A & D 20000 units CREA      trolamine salicylate (ASPERCREME) 10 % cream Apply 1 application topically as needed for muscle pain.     Trolamine Salicylate  (ASPERCREME) 10 % LOTN      No current facility-administered medications for this visit.    Allergies:   Azithromycin, Doxycycline, Erythromycin, Penicillins, and Sulfamethoxazole-trimethoprim   Social History:  The patient  reports that she has never smoked. She has never used smokeless tobacco. She reports that she does not drink alcohol and does not use drugs.   Family History:  The patient's family history includes Breast cancer in her maternal aunt; COPD in her mother; Heart disease in her maternal grandfather and mother; Hypothyroidism in her father; Parkinson's disease in her father.  ROS:  Please see the history of present illness.    All other systems are reviewed and otherwise negative.   PHYSICAL EXAM:  VS:  LMP 11/22/2011  BMI: There is no height or weight on file to calculate BMI. Well nourished, well developed, in no acute distress  HEENT: normocephalic, atraumatic  Neck: no JVD, carotid bruits or masses Cardiac:  RRR; no significant murmurs, no rubs, or gallops Lungs: CTA b/l, no wheezing, rhonchi or rales  Abd: soft, nontender MS: no deformity or atrophy Ext:  no edema  Skin: warm and dry, no rash Neuro:  No gross deficits appreciated Psych: euthymic mood, full affect  PPM site (left abdomen) is stable, no tethering or discomfort   EKG:  Done today and reviewed by myself shows  AV paced 62bpm  PPM interrogation done today and reviewed by myself:  ERI reached 08/09/21 Underlying is SB 50's/ V dependent Lead measurements are good No arrhythmias AP 81% VP >99%   07/02/2005: [facility] TEE SUMMARY   -  Overall left ventricular systolic function was normal.   -  There was no atheroma of the aortic arch.   -  IAS: Aneurysmal with very small PFO. Mildly positive Right to         Left Shunting.   -  There was the appearance of a catheter or pacing wire in the         right ventricle.   -  Finnstrated mobile multiple masses, Small in size, very         suspicious  for vegetations. The TV moderate to severe TR.         Moderate pulmonary HTN.   -  There was the appearance of a catheter or pacing wire in the         right atrium.   COMPARISONS   -  Compared to the previous study of 21-Aug-2004 : There are small         mobile mass attached to the tricuspid valve leaflets on the         atrial side. These are very suspicious for vegetations. These         mobile masses were noted previously(1-2 only), but appear to         be more in number. The previously noted. The tricuspid         regurgitation appears to be moderately severe compared to         moderate previously. Clinical corelation highly recomended.     Recent Labs: 04/03/2021: ALT 12; BUN 12; Creatinine, Ser 0.95; Potassium 4.5; Sodium 141  No results found for requested labs within last 365 days.   CrCl cannot be calculated (Patient's most recent lab result is older than the maximum 21 days allowed.).   Wt Readings from Last 3 Encounters:  05/18/21 195 lb 12.8 oz (88.8 kg)  04/03/21 197 lb (89.4 kg)  09/23/20 191 lb 3.2 oz (86.7 kg)     Other studies reviewed: Additional studies/records reviewed today include: summarized above  ASSESSMENT AND PLAN:  1. PPM    She is dependent    In d/w Dr. [name], plan gen change alone, no plans to pursue transvenous system with occluded Subclavian veins    Pt also mentions she would not be agreeable to that anyway  We discussed generator change procedure, potential risks/benefits, she is agreeable to proceed She has already been scheduled for her procedure  2. Afib     CHA2DS2Vasc is not on a/c (pt choice)     0 % burden     She has declined a/c  3. HTN     Looks OK  Disposition: usual follow up post procedure     Current medicines are reviewed at length with the patient today.  The patient did not have any concerns regarding medicines.  Veronica Fredrickson, PA-C 09/15/2021 11:53 AM     [facility] [address] [place] [address] [phone] (office)  [phone] (fax)

## 2021-09-18 ENCOUNTER — Encounter: Payer: Self-pay | Admitting: Physician Assistant

## 2021-09-18 ENCOUNTER — Ambulatory Visit (INDEPENDENT_AMBULATORY_CARE_PROVIDER_SITE_OTHER): Payer: Managed Care, Other (non HMO) | Admitting: Physician Assistant

## 2021-09-18 VITALS — BP 138/80 | HR 62 | Ht 63.0 in | Wt 191.6 lb

## 2021-09-18 DIAGNOSIS — Z45010 Encounter for checking and testing of cardiac pacemaker pulse generator [battery]: Secondary | ICD-10-CM | POA: Diagnosis not present

## 2021-09-18 DIAGNOSIS — Z01818 Encounter for other preprocedural examination: Secondary | ICD-10-CM | POA: Diagnosis not present

## 2021-09-18 DIAGNOSIS — Z950 Presence of cardiac pacemaker: Secondary | ICD-10-CM | POA: Diagnosis not present

## 2021-09-18 DIAGNOSIS — I10 Essential (primary) hypertension: Secondary | ICD-10-CM

## 2021-09-18 DIAGNOSIS — I480 Paroxysmal atrial fibrillation: Secondary | ICD-10-CM

## 2021-09-18 LAB — CUP PACEART INCLINIC DEVICE CHECK
Battery Impedance: 26100 Ohm
Battery Voltage: 2.49 V
Date Time Interrogation Session: 20230807130915
Implantable Lead Implant Date: 20070629
Implantable Lead Implant Date: 20070629
Implantable Lead Location: 753859
Implantable Lead Location: 753860
Implantable Lead Model: 511212
Implantable Lead Model: 511212
Implantable Lead Serial Number: 109900
Implantable Lead Serial Number: 109901
Implantable Pulse Generator Implant Date: 20140314
Lead Channel Impedance Value: 313 Ohm
Lead Channel Impedance Value: 358 Ohm
Lead Channel Pacing Threshold Amplitude: 1 V
Lead Channel Pacing Threshold Amplitude: 1 V
Lead Channel Pacing Threshold Pulse Width: 0.4 ms
Lead Channel Pacing Threshold Pulse Width: 0.5 ms
Lead Channel Setting Pacing Amplitude: 2 V
Lead Channel Setting Pacing Amplitude: 2.5 V
Lead Channel Setting Pacing Pulse Width: 0.5 ms
Lead Channel Setting Sensing Sensitivity: 6 mV
Pulse Gen Model: 5826
Pulse Gen Serial Number: 7424521

## 2021-09-18 NOTE — Patient Instructions (Addendum)
Medication Instructions:   Your physician recommends that you continue on your current medications as directed. Please refer to the Current Medication list given to you today.  *If you need a refill on your cardiac medications before your next appointment, please call your pharmacy*   Lab Work:  BMET AND CBC TODAY   If you have labs (blood work) drawn today and your tests are completely normal, you will receive your results only by: MyChart Message (if you have MyChart) OR A paper copy in the mail If you have any lab test that is abnormal or we need to change your treatment, we will call you to review the results.   Testing/Procedures:   SEE ATTACHED LETTER FOR GENERATOR CHANGE    Follow-Up: At [facility], you and your health needs are our priority.  As part of our continuing mission to provide you with exceptional heart care, we have created designated Provider Care Teams.  These Care Teams include your primary Cardiologist (physician) and Advanced Practice Providers (APPs -  Physician Assistants and Nurse Practitioners) who all work together to provide you with the care you need, when you need it.  We recommend signing up for the patient portal called "MyChart".  Sign up information is provided on this After Visit Summary.  MyChart is used to connect with patients for Virtual Visits (Telemedicine).  Patients are able to view lab/test results, encounter notes, upcoming appointments, etc.  Non-urgent messages can be sent to your provider as well.   To learn more about what you can do with MyChart, go to [email or web address].    Your next appointment:  10-14 DAYS AFTER  10-06-21 WOUND CHECK WITH DEVICE CLINIC    AND  3 MONTHS PHYS PACER [facility]   The format for your next appointment:   In Person  Provider:   [name], MD    Other Instructions   Important Information About Sugar

## 2021-09-19 ENCOUNTER — Other Ambulatory Visit: Payer: Self-pay | Admitting: *Deleted

## 2021-09-19 DIAGNOSIS — I442 Atrioventricular block, complete: Secondary | ICD-10-CM

## 2021-09-19 LAB — CBC
Hematocrit: 43.6 % (ref 34.0–46.6)
Hemoglobin: 14 g/dL (ref 11.1–15.9)
MCH: 27.3 pg (ref 26.6–33.0)
MCHC: 32.1 g/dL (ref 31.5–35.7)
MCV: 85 fL (ref 79–97)
Platelets: 240 10*3/uL (ref 150–450)
RBC: 5.12 x10E6/uL (ref 3.77–5.28)
RDW: 12.8 % (ref 11.7–15.4)
WBC: 7.5 10*3/uL (ref 3.4–10.8)

## 2021-09-19 LAB — BASIC METABOLIC PANEL
BUN/Creatinine Ratio: 13 (ref 12–28)
BUN: 12 mg/dL (ref 8–27)
CO2: 24 mmol/L (ref 20–29)
Calcium: 9.3 mg/dL (ref 8.7–10.3)
Chloride: 105 mmol/L (ref 96–106)
Creatinine, Ser: 0.95 mg/dL (ref 0.57–1.00)
Glucose: 79 mg/dL (ref 70–99)
Potassium: 4.4 mmol/L (ref 3.5–5.2)
Sodium: 144 mmol/L (ref 134–144)
eGFR: 69 mL/min/{1.73_m2} (ref >59)

## 2021-09-19 NOTE — Progress Notes (Signed)
For Echocardiogram

## 2021-09-21 ENCOUNTER — Ambulatory Visit: Payer: Managed Care, Other (non HMO) | Admitting: Adult Health

## 2021-09-21 ENCOUNTER — Encounter: Payer: Self-pay | Admitting: Adult Health

## 2021-09-21 VITALS — BP 125/60 | HR 62 | Ht 63.0 in | Wt 189.0 lb

## 2021-09-21 DIAGNOSIS — G20 Parkinson's disease: Secondary | ICD-10-CM | POA: Diagnosis not present

## 2021-09-21 MED ORDER — CARBIDOPA-LEVODOPA ER 25-100 MG PO TBCR
1.0000 | EXTENDED_RELEASE_TABLET | Freq: Two times a day (BID) | ORAL | 5 refills | Status: DC
Start: 2021-09-21 — End: 2022-02-19

## 2021-09-21 NOTE — Progress Notes (Signed)
[facility] [address] [place]. [place] [address] [phone]       OFFICE FOLLOW UP NOTE  [name] Date of Birth:  [birth year] Medical Record Number:  [id number]    Primary neurologist: Dr. 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 Frances Furbish for 1+ yr hx of tremors, fine motor incoordination, difficulty with balance and falls.  Completed DaTscan 04/2021 which showed significantly decreased activity in the bilateral putamen associated with parkinsonian syndrome pathology.  She was started on Sinemet at prior visit on 05/18/2021.  Update 09/21/2021 JM: Patient returns for follow-up visit after prior visit with Dr. Frances Furbish 4 months ago.  She was started on Sinemet at that time taking half tab 3 times a day and gradually working up to 1 tab 3 times a day.  She reports difficulty tolerating due to increased fatigue which she felt even with the half tab dosing.  She does report improvement of her tremor since starting but continues to feel generalized stiffness and gait impairment.  She does have chronic right ankle stiffness but believes this is more from an old ankle injury.  Denies any new or worsening symptoms.  She does question use of a medicinal plant called Mucuna pruriens. She reports she has been reading about it online.  No further concerns at this time      History provided for reference purposes only 05/18/2021 Dr. Frances Furbish: She reports feeling about the same.  She has read  up on Parkinsonian syndromes.  She also read her DaTscan report.  She reports that her dad had Parkinson's disease.  He was not labeled as atypical she she currently does not take her Lyrica.  She takes Flexeril only sparingly when needed for back pain.  She has not had any recent falls.  She would be willing to start levodopa therapy.  She recalls that her handwriting changed some 3 to 4 years ago even.  She frequently basis, she is supposed to have a pacemaker changed in September of this year.   The patient's allergies, current medications, family history, past medical history, past social history, past surgical history and problem list were reviewed and updated as appropriate.     04/03/21 Dr. Frances Furbish: (She) reports a 1+ year history of intermittent hand tremors, balance issues, recurrent falls, fine motor dyscontrol, changes in her gait.  She reports that her dad had Parkinson's disease.  She has noticed her symptoms essentially after she had COVID in September 2021.  She has not tried any medication for her tremor.  She has fallen backwards in her yard, but most of her falls have been accidental where she tripped over something on uneven ground.  She fell in 2018 and hit her forehead but did not lose consciousness, she had a laceration but repaired it herself.  She did not seek medical attention at the time.  She was vacationing at the time. I reviewed your office note from 12/19/2020. She had her first pacemaker when she was about 61 years  old.  She lives with her husband, she has a history of left eye amblyopia and recently received new glasses.  She has no children.  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  She drinks caffeine, about 2-3 servings per day, rare alcohol, she is a non-smoker.  Her tremor affects both hands but seems to be worse on the left, it comes and goes, sometimes she feels that she can suppress it.  She does have a tendency to bother her feet when nervous or just by  habit.  She feels that she is weaker in her muscles, does admit to not being very active and probably having some deconditioning.          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   Hypertension    Hypothyroidism    Pacemaker    Presence of permanent cardiac pacemaker    PVC (premature ventricular contraction)     PSH:  Past Surgical History:  Procedure Laterality Date   INSERT / REPLACE / REMOVE PACEMAKER     s/p PPM implant 1978 for congenital CHB    PERMANENT PACEMAKER GENERATOR CHANGE N/A 04/25/2012   Procedure: PERMANENT PACEMAKER GENERATOR CHANGE;  Surgeon: Marinus Maw, MD;  Location: [facility] CATH LAB;  Service: Cardiovascular;  Laterality: N/A;    Social History:  Social History   Socioeconomic History   Marital status: Married    Spouse name: Not on file   Number of children: Not on file   Years of education: Not on file   Highest education level: Not on file  Occupational History   Not on file  Tobacco Use   Smoking status: Never   Smokeless tobacco: Never  Vaping Use   Vaping Use: Never used  Substance and Sexual Activity   Alcohol use: No   Drug use: No   Sexual activity: Not on file  Other Topics Concern   Not on file  Social History Narrative   Left handed   Social Determinants of Health   Financial Resource Strain: Not on file  Food Insecurity: Not on file  Transportation Needs: Not on file  Physical Activity: Not on file  Stress: Not on file  Social Connections: Not on file  Intimate Partner Violence: Not on file    Family History:  Family History  Problem Relation Age of Onset   Heart disease Mother    COPD Mother    Hypothyroidism Father        she knows minimal about her father/his family   Parkinson's disease Father    Heart disease Maternal Grandfather    Breast cancer  Maternal Aunt     Medications:   Current Outpatient Medications on File Prior to Visit  Medication Sig Dispense Refill   ascorbic acid (VITAMIN C) 500 MG tablet Take 500 mg by mouth daily.     aspirin EC 81 MG tablet Take 81 mg by mouth daily.     calcium carbonate (TUMS - DOSED IN MG ELEMENTAL CALCIUM) 500 MG chewable tablet Chew 1 tablet by mouth as needed (Take as directed).      carbidopa-levodopa (SINEMET IR) 25-100 MG tablet Take 1 tablet by mouth 3 (three) times daily. Follow titration instructions provided. 90 tablet 3   cephALEXin (KEFLEX) 500 MG capsule TAKE 4 CAPSULES  1 HOUR PRIOR TO PROCEDURE. 4 capsule 2   Cholecalciferol (VITAMIN D) 50 MCG (2000 UT) tablet Take 2,000 Units by mouth daily.     cyclobenzaprine (FLEXERIL) 10 MG tablet Take 1 tablet by mouth 3 (three) times daily as needed for spasms.     diphenhydrAMINE (BENADRYL) 25 mg capsule Take 25 mg by mouth every 6 (six) hours as needed for itching or allergies.     diphenhydrAMINE (SOMINEX) 25 MG tablet Take 25 mg by mouth at bedtime as needed for sleep.     furosemide (LASIX) 20 MG tablet Take 20 mg by mouth daily.     ibuprofen (ADVIL,MOTRIN) 200 MG tablet Take 200 mg by mouth every 6 (six) hours as needed for pain.     levothyroxine (SYNTHROID, LEVOTHROID) 88 MCG tablet Take 88 mcg by mouth at bedtime.      meloxicam (MOBIC) 15 MG tablet Take 1 tablet by mouth daily as needed for pain.     metoprolol (LOPRESSOR) 100 MG tablet Take 50 mg by mouth daily.     trolamine salicylate (ASPERCREME) 10 % cream Apply 1 application topically as needed for muscle pain.     No current facility-administered medications on file prior to visit.    Allergies:   Allergies  Allergen Reactions   Azithromycin Rash   Doxycycline Rash   Erythromycin Nausea And Vomiting   Penicillins Rash   Sulfamethoxazole-Trimethoprim Rash      OBJECTIVE:  Physical Exam  Vitals:   09/21/21 1440  BP: 125/60  Pulse: 62  Weight: 189 lb (85.7  kg)  Height: 5\' 3"  (1.6 m)   Body mass index is 33.48 kg/m. No results found.   General: well developed, well nourished, pleasant middle-age Caucasian female, seated, in no evident distress HEENT: head normocephalic and atraumatic.  Mild facial masking, mild nuchal rigidity Neck: supple with no carotid or supraclavicular bruits Cardiovascular: regular rate and rhythm, no murmurs Musculoskeletal: Limited right ankle ROM 2/2 prior ankle injury Skin:  no rash/petichiae Vascular:  Normal pulses all extremities   Neurologic Exam Mental Status: Awake and fully alert.  Fluent speech and language.  Oriented to place and time. Recent and remote memory intact. Attention span, concentration and fund of knowledge appropriate. Mood and affect flat  Cranial Nerves: Pupils equal, briskly reactive to light. Extraocular movements full without nystagmus. Visual fields full to confrontation. Hearing intact. Facial sensation intact. Face, tongue, palate moves normally and symmetrically.  Motor: Normal strength in all tested extremity muscles.  Mildly increased tone RUE.  Unable to appreciate resting tremor.  Slight postural tremor of both upper extremities. Sensory.: intact to touch , pinprick , position and vibratory sensation.  Coordination: Rapid alternating movements mildly impaired in both hands. Finger-to-nose and heel-to-shin performed accurately bilaterally. Gait and Station: Arises from chair without difficulty.  Posture is mildly stooped. Gait demonstrates decreased stride length and step height initially but improved after about 10 feet with increased pace speed. Decreased R>L arm swing.  Mild dystonic posturing of both upper extremities.  Mild difficulty with turns.  Ambulates without assistive device. Reflexes: 1+ and symmetric. Toes downgoing.      ASSESSMENT/PLAN: [name] is a 61 y.o. year old female    Parkinsonism  -switch Sinemet IR to Sinemet CR 25-100 BID to due drowsiness on  IR dosing. If difficulty tolerating twice daily dosing, could consider decreasing to once daily  -discussed use of medicinal plant mucuna pruriens - will further discuss with Dr. 67 to see if  she is familiar with this type of treatment but would prefer for her to try different FDA approved medications to treat Parkinson's at this time. Although hesitant, she agreed to plan      Follow up in 3 months with Dr. Frances Furbish for further medication recommendations/management or call earlier if needed    CC:  PCP: Linus Galas, NP    I spent 31 minutes of face-to-face and non-face-to-face time with patient.  This included previsit chart review, lab review, study review, order entry, electronic health record documentation, patient education regarding above diagnoses, current treatment plan and future treatment plan and answered all other questions to patient satisfaction   Ihor Austin, [facility]  [facility] [address] [place], [place] [address]  Phone [phone] Fax [phone] Note: This document was prepared with digital dictation and possible smart phrase technology. Any transcriptional errors that result from this process are unintentional.

## 2021-09-21 NOTE — Patient Instructions (Addendum)
Your Plan:  Recommend switching Sinemet IR to Sinemet CR 25-100mg  twice daily (take 12 hrs apart)  Please follow up with Dr. Frances Furbish in 3 months or call earlier if needed      Thank you for coming to see [place] at [facility]. I hope we have been able to provide you high quality care today.  You may receive a patient satisfaction survey over the next few weeks. We would appreciate your feedback and comments so that we may continue to improve ourselves and the health of our patients.

## 2021-09-25 ENCOUNTER — Other Ambulatory Visit: Payer: Self-pay | Admitting: Internal Medicine

## 2021-10-04 ENCOUNTER — Ambulatory Visit (HOSPITAL_COMMUNITY): Payer: Managed Care, Other (non HMO)

## 2021-10-05 ENCOUNTER — Ambulatory Visit (HOSPITAL_COMMUNITY): Payer: Managed Care, Other (non HMO) | Attending: Physician Assistant

## 2021-10-05 DIAGNOSIS — I10 Essential (primary) hypertension: Secondary | ICD-10-CM | POA: Diagnosis not present

## 2021-10-05 DIAGNOSIS — Z950 Presence of cardiac pacemaker: Secondary | ICD-10-CM | POA: Diagnosis not present

## 2021-10-05 DIAGNOSIS — I442 Atrioventricular block, complete: Secondary | ICD-10-CM | POA: Insufficient documentation

## 2021-10-05 LAB — ECHOCARDIOGRAM COMPLETE
Area-P 1/2: 4.31 cm2
S' Lateral: 3.5 cm

## 2021-10-05 NOTE — Pre-Procedure Instructions (Signed)
Instructed patient on the following items: Arrival time 1300 Nothing to eat or drink after midnight No meds AM of procedure Responsible person to drive you home and stay with you for 24 hrs Wash with special soap night before and morning of procedure  

## 2021-10-06 ENCOUNTER — Ambulatory Visit (HOSPITAL_COMMUNITY)
Admission: RE | Admit: 2021-10-06 | Discharge: 2021-10-06 | Disposition: A | Discharge status: Home / Self Care | Payer: Managed Care, Other (non HMO) | Attending: Internal Medicine | Admitting: Internal Medicine

## 2021-10-06 ENCOUNTER — Other Ambulatory Visit: Payer: Self-pay

## 2021-10-06 ENCOUNTER — Encounter (HOSPITAL_COMMUNITY)
Admission: RE | Disposition: A | Discharge status: Home / Self Care | Payer: Managed Care, Other (non HMO) | Source: Home / Self Care | Attending: Internal Medicine

## 2021-10-06 DIAGNOSIS — I442 Atrioventricular block, complete: Secondary | ICD-10-CM | POA: Diagnosis not present

## 2021-10-06 DIAGNOSIS — E039 Hypothyroidism, unspecified: Secondary | ICD-10-CM | POA: Diagnosis not present

## 2021-10-06 DIAGNOSIS — Z45010 Encounter for checking and testing of cardiac pacemaker pulse generator [battery]: Secondary | ICD-10-CM | POA: Diagnosis not present

## 2021-10-06 DIAGNOSIS — J45909 Unspecified asthma, uncomplicated: Secondary | ICD-10-CM | POA: Diagnosis not present

## 2021-10-06 DIAGNOSIS — I10 Essential (primary) hypertension: Secondary | ICD-10-CM | POA: Diagnosis not present

## 2021-10-06 DIAGNOSIS — I4891 Unspecified atrial fibrillation: Secondary | ICD-10-CM | POA: Diagnosis not present

## 2021-10-06 HISTORY — PX: PPM GENERATOR CHANGEOUT: EP1233

## 2021-10-06 SURGERY — PPM GENERATOR CHANGEOUT

## 2021-10-06 MED ORDER — LIDOCAINE HCL (PF) 1 % IJ SOLN
INTRAMUSCULAR | Status: AC
  Filled 2021-10-06: qty 60

## 2021-10-06 MED ORDER — ONDANSETRON HCL 4 MG/2ML IJ SOLN: 4.0000 mg | Freq: Four times a day (QID) | INTRAMUSCULAR | Status: DC | PRN

## 2021-10-06 MED ORDER — CHLORHEXIDINE GLUCONATE 4 % EX LIQD
4.0000 | Freq: Once | CUTANEOUS | Status: DC
  Filled 2021-10-06: qty 60

## 2021-10-06 MED ORDER — SODIUM CHLORIDE 0.9 % IV SOLN
INTRAVENOUS | Status: AC
  Filled 2021-10-06: qty 2

## 2021-10-06 MED ORDER — DIPHENHYDRAMINE HCL 50 MG/ML IJ SOLN
INTRAMUSCULAR | Status: DC | PRN
  Administered 2021-10-06: 25 mg via INTRAVENOUS

## 2021-10-06 MED ORDER — SODIUM CHLORIDE 0.9 % IV SOLN
80.0000 mg | INTRAVENOUS | Status: AC
  Administered 2021-10-06: 80 mg

## 2021-10-06 MED ORDER — SODIUM CHLORIDE 0.9 % IV SOLN: INTRAVENOUS | Status: DC

## 2021-10-06 MED ORDER — ACETAMINOPHEN 325 MG PO TABS: 325.0000 mg | ORAL_TABLET | ORAL | Status: DC | PRN

## 2021-10-06 MED ORDER — VANCOMYCIN HCL IN DEXTROSE 1-5 GM/200ML-% IV SOLN
INTRAVENOUS | Status: AC
  Filled 2021-10-06: qty 200

## 2021-10-06 MED ORDER — DIPHENHYDRAMINE HCL 50 MG/ML IJ SOLN
INTRAMUSCULAR | Status: AC
  Filled 2021-10-06: qty 1

## 2021-10-06 MED ORDER — VANCOMYCIN HCL IN DEXTROSE 1-5 GM/200ML-% IV SOLN
1000.0000 mg | INTRAVENOUS | Status: AC
  Administered 2021-10-06: 1000 mg via INTRAVENOUS

## 2021-10-06 MED ORDER — POVIDONE-IODINE 10 % EX SWAB
2.0000 | Freq: Once | CUTANEOUS | Status: AC
  Administered 2021-10-06: 2 via TOPICAL

## 2021-10-06 MED ORDER — LIDOCAINE HCL (PF) 1 % IJ SOLN
INTRAMUSCULAR | Status: DC | PRN
  Administered 2021-10-06: 60 mL

## 2021-10-06 SURGICAL SUPPLY — 4 items
CABLE SURGICAL S-101-97-12 (CABLE) ×1 IMPLANT
PACEMAKER ASSURITY DR-RF (Pacemaker) IMPLANT
PAD DEFIB RADIO PHYSIO CONN (PAD) ×1 IMPLANT
TRAY PACEMAKER INSERTION (PACKS) ×1 IMPLANT

## 2021-10-06 NOTE — Discharge Instructions (Signed)

## 2021-10-06 NOTE — H&P (Signed)
:  09/15/2021  Patient ID:  [name], DOB 1960/04/05, MRN [id number] PCP:  Linus Galas, NP  EP:  Dr. [name]     Chief Complaint: ERI   History of Present Illness: [name] is a 61 y.o. female with history of HTN, hypothyroidism, asthma, congential CHB, Afib.   April 2020 she was found with a marked increase in Afib burden, with 40 days of AF, Dr. [name] recommended via phone notes starting Eliquis, she was reluctant given meloxicam, recommended reduction in dose and use of meloxicam and starting Eliquis, patient remained leary, planned for virtual visit    She saw Dr. [name] May 2020 via tele health, she had been found with AFib (she thought triggered by stress at work), recommended a/c, though she declined.  Planned for annual visit.   She last saw Dr. [name] 09/23/20, some edema suspected 2/2 being on her feet for prolonged periods and diet. No changes were made.   TODAY She feels well Unfortunately has been terminated from her job, but otherwise, doing OK. She c/w baseline tremor No CP, palpitations No SOB No near syncope or syncope.   She mentions her antibiotic regime for her pacer procedures has been vancomycin with IV benadryl.  Will have some itching but does OK     Device information SJM dual chamber PPM, gen change 2014 (L abdominal implant) Her leads are epicardial from 2007     08/10/2005 OP report (Dr. Laneta Simmers) Median sternotomy, extracorporeal circulation, removal  of infected permanent pacemaker system via the right atrium.  Implantation  of new epicardial dual-chamber permanent pacing system   08/22/2001: OP report (Dr. Aleen Campi) her first pacemaker at age 31 at the [facility] [place]  This was a VVI pacemaker with a large pulse generator and this pulse generator later migrated through her right breast and extruded underneath her right breast on the lower aspect of the pulse generator.  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 Orson Slick, general surgeon, and replaced it with a smaller unit, higher in her right chest.  She did well with that unit still being a VVI pacer from 1983 until 1990 when that pulse generator had battery failure.  Her third pacemaker then was implanted in 1990 at [facility] at which time we upgraded the system to a dual chamber pacemaker by inserting a new atrial lead into the right subclavian vein.  She states that we encountered marked difficulty in inserting the new atrial lead in 1990.  This was a unipolar lead and she continued to have a unipolar system.  She now returns 13 years later with battery depletion showing signs of expected replacement time of the pulse Generator... 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  She had good collaterals around this section.  We then discussed alternatives again with the patient and informed her that we were going to be unable to put in a new atrial lead in the right side and made a recommendation to put in a new bipolar system in the left chest using the left subclavian vein.  However, she refused to consider this option.  We felt that this was her best longterm alternative.  After she refused this alternative, we offered to cap off the atrial channel and the new pulse generator which was on the table and continue pacing her in a VVI mode.  She felt that this was the least invasive of all alternatives and wanted [place] to proceed with this alternative. We then proceeded to cap off the atrial channel on the new pulse generator and continued to pace in a VVI mode   1st device was Right abdomen Extracted        Past Medical History:  Diagnosis Date   Asthma     Congenital complete AV block 1978   Hypertension     Hypothyroidism     Presence of permanent cardiac pacemaker     PVC (premature ventricular contraction)             Past  Surgical History:  Procedure Laterality Date   INSERT / REPLACE / REMOVE PACEMAKER        s/p PPM implant 1978 for congenital CHB    PERMANENT PACEMAKER GENERATOR CHANGE N/A 04/25/2012    Procedure: PERMANENT PACEMAKER GENERATOR CHANGE;  Surgeon: Marinus Maw, MD;  Location: [facility] CATH LAB;  Service: Cardiovascular;  Laterality: N/A;            Current Outpatient Medications  Medication Sig Dispense Refill   aspirin EC 81 MG tablet Take 81 mg by mouth daily.       calcium carbonate (TUMS - DOSED IN MG ELEMENTAL CALCIUM) 500 MG chewable tablet Chew 1 tablet by mouth as needed (Take as directed).        carbidopa-levodopa (SINEMET IR) 25-100 MG tablet Take 1 tablet by mouth 3 (three) times daily. Follow titration instructions provided. 90 tablet 3   cephALEXin (KEFLEX) 500 MG capsule TAKE 4 CAPSULES 1 HOUR PRIOR TO PROCEDURE. 4 capsule 2   cyclobenzaprine (FLEXERIL) 10 MG tablet Take 1 tablet by mouth 3 (three) times daily as needed for spasms.       diphenhydrAMINE (BENADRYL) 25 mg capsule Take 25 mg by mouth every 6 (six) hours as needed for itching or allergies.       diphenhydrAMINE (SOMINEX) 25 MG tablet Take 25 mg by mouth at bedtime as needed for sleep.       furosemide (LASIX) 20 MG tablet Take 20 mg by mouth daily.       hydrocortisone 1 % lotion Apply 1 application topically 2 (two) times daily. 118 mL 0   ibuprofen (ADVIL,MOTRIN) 200 MG tablet Take 200 mg by mouth every 6 (six) hours as needed for pain.       levothyroxine (SYNTHROID, LEVOTHROID) 88 MCG tablet Take 88 mcg by mouth at bedtime.        meloxicam (MOBIC) 15 MG tablet Take 1 tablet by mouth daily as needed for pain.       metoprolol (LOPRESSOR) 100 MG tablet Take 50 mg by mouth daily.       pregabalin (LYRICA) 25 MG capsule Take 25 mg by mouth 2 (two) times daily.        RA Vitamin E-Vit A & D 20000 units CREA         trolamine salicylate (ASPERCREME) 10 % cream Apply 1 application topically as needed for muscle pain.        Trolamine Salicylate (ASPERCREME) 10 % LOTN          No current facility-administered medications for this visit.      Allergies:   Azithromycin, Doxycycline, Erythromycin, Penicillins, and Sulfamethoxazole-trimethoprim    Social History:  The patient  reports that she has never smoked. She has never used smokeless tobacco. She reports that she does not drink alcohol and does  not use drugs.    Family History:  The patient's family history includes Breast cancer in her maternal aunt; COPD in her mother; Heart disease in her maternal grandfather and mother; Hypothyroidism in her father; Parkinson's disease in her father.   ROS:  Please see the history of present illness.    All other systems are reviewed and otherwise negative.    PHYSICAL EXAM:  VS:  LMP 11/22/2011  BMI: There is no height or weight on file to calculate BMI. Well nourished, well developed, in no acute distress  HEENT: normocephalic, atraumatic  Neck: no JVD, carotid bruits or masses Cardiac:  RRR; no significant murmurs, no rubs, or gallops Lungs: CTA b/l, no wheezing, rhonchi or rales  Abd: soft, nontender MS: no deformity or atrophy Ext:  no edema  Skin: warm and dry, no rash Neuro:  No gross deficits appreciated Psych: euthymic mood, full affect   PPM site (left abdomen) is stable, no tethering or discomfort     EKG:  Done today and reviewed by myself shows  AV paced 62bpm   PPM interrogation done today and reviewed by myself:  ERI reached 08/09/21 Underlying is SB 50's/ V dependent Lead measurements are good No arrhythmias AP 81% VP >99%     07/02/2005: [facility] TEE SUMMARY   -  Overall left ventricular systolic function was normal.   -  There was no atheroma of the aortic arch.   -  IAS: Aneurysmal with very small PFO. Mildly positive Right to         Left Shunting.   -  There was the appearance of a catheter or pacing wire in the         right ventricle.   -  Finnstrated mobile multiple masses,  Small in size, very         suspicious for vegetations. The TV moderate to severe TR.         Moderate pulmonary HTN.   -  There was the appearance of a catheter or pacing wire in the         right atrium.   COMPARISONS   -  Compared to the previous study of 21-Aug-2004 : There are small         mobile mass attached to the tricuspid valve leaflets on the         atrial side. These are very suspicious for vegetations. These         mobile masses were noted previously(1-2 only), but appear to         be more in number. The previously noted. The tricuspid         regurgitation appears to be moderately severe compared to         moderate previously. Clinical corelation highly recomended.        Recent Labs: 04/03/2021: ALT 12; BUN 12; Creatinine, Ser 0.95; Potassium 4.5; Sodium 141  No results found for requested labs within last 365 days.    CrCl cannot be calculated (Patient's most recent lab result is older than the maximum 21 days allowed.).       Wt Readings from Last 3 Encounters:  05/18/21 195 lb 12.8 oz (88.8 kg)  04/03/21 197 lb (89.4 kg)  09/23/20 191 lb 3.2 oz (86.7 kg)      Other studies reviewed: Additional studies/records reviewed today include: summarized above   ASSESSMENT AND PLAN:   1. PPM    She is dependent    In d/w  Dr. Lovena Le, plan gen change alone, no plans to pursue transvenous system with occluded Subclavian veins    Pt also mentions she would not be agreeable to that anyway   We discussed generator change procedure, potential risks/benefits, she is agreeable to proceed She has already been scheduled for her procedure   2. Afib     CHA2DS2Vasc is not on a/c (pt choice)     0 % burden     She has declined a/c   3. HTN     Looks OK     Disposition: usual follow up post procedure         Current medicines are reviewed at length with the patient today.  The patient did not have any concerns regarding medicines.   Venetia Night,  PA-C 09/15/2021 11:53 AM     [facility] [address] [place] [address] [phone] (office)  [phone] (fax)   EP attending  Patient seen and examined.  Agree with the findings as noted above.  The patient has complete heart block and is status post pacemaker insertion with epicardial leads which are working normally.  She has reached elective replacement.  I discussed the indications, risk, goals, benefits, and expectations of pacemaker generator removal and insertion of a new dual-chamber pacemaker and she wishes to proceed.  Cristopher Peru, MD

## 2021-10-09 ENCOUNTER — Encounter (HOSPITAL_COMMUNITY): Payer: Self-pay | Admitting: Internal Medicine

## 2021-10-19 ENCOUNTER — Ambulatory Visit: Payer: No Typology Code available for payment source | Attending: Interventional Cardiology

## 2021-10-19 DIAGNOSIS — Q246 Congenital heart block: Secondary | ICD-10-CM

## 2021-10-19 LAB — CUP PACEART INCLINIC DEVICE CHECK
Battery Remaining Longevity: 57 mo
Battery Voltage: 3.01 V
Brady Statistic RA Percent Paced: 91 %
Brady Statistic RV Percent Paced: 99.97 %
Date Time Interrogation Session: 20230907142200
Implantable Lead Implant Date: 20070629
Implantable Lead Implant Date: 20070629
Implantable Lead Location: 753859
Implantable Lead Location: 753860
Implantable Lead Model: 511212
Implantable Lead Model: 511212
Implantable Lead Serial Number: 109900
Implantable Lead Serial Number: 109901
Implantable Pulse Generator Implant Date: 20230825
Lead Channel Impedance Value: 312.5 Ohm
Lead Channel Impedance Value: 350 Ohm
Lead Channel Pacing Threshold Amplitude: 0.75 V
Lead Channel Pacing Threshold Amplitude: 0.75 V
Lead Channel Pacing Threshold Amplitude: 1 V
Lead Channel Pacing Threshold Amplitude: 1 V
Lead Channel Pacing Threshold Pulse Width: 0.4 ms
Lead Channel Pacing Threshold Pulse Width: 0.4 ms
Lead Channel Pacing Threshold Pulse Width: 0.6 ms
Lead Channel Pacing Threshold Pulse Width: 0.6 ms
Lead Channel Sensing Intrinsic Amplitude: 0.3 mV
Lead Channel Sensing Intrinsic Amplitude: 7 mV
Lead Channel Setting Pacing Amplitude: 2 V
Lead Channel Setting Pacing Amplitude: 3 V
Lead Channel Setting Pacing Pulse Width: 0.6 ms
Lead Channel Setting Sensing Sensitivity: 6 mV
Pulse Gen Model: 2272
Pulse Gen Serial Number: 8099014

## 2021-10-19 NOTE — Progress Notes (Signed)
Wound check appointment. Steri-strips removed. Wound without redness or edema. Incision edges approximated, wound well healed. Normal device function. Thresholds, sensing, and impedances consistent with previous measurements. Histogram distribution appropriate for patient and level of activity. No mode switches or high ventricular rates noted. Patient educated about wound care. ROV in 3 months with implanting physician.  Abdominal pacemaker generator change.

## 2021-10-19 NOTE — Patient Instructions (Signed)

## 2021-10-28 ENCOUNTER — Encounter: Payer: Self-pay | Admitting: Neurology

## 2021-10-29 ENCOUNTER — Encounter: Payer: Self-pay | Admitting: Internal Medicine

## 2021-10-31 NOTE — Telephone Encounter (Signed)
Transmission received 10/28/2021 and 10/31/2021

## 2021-11-01 ENCOUNTER — Ambulatory Visit: Payer: No Typology Code available for payment source | Attending: Cardiology

## 2021-11-01 ENCOUNTER — Telehealth: Payer: Self-pay | Admitting: Internal Medicine

## 2021-11-01 NOTE — Progress Notes (Signed)
Patient evaluated in clinic this afternoon by this nurse Leticia Penna, RN.  At left abdominal PPM incision site, assessed 1 stitch end that has broken through distal left end of lateral incision line.  There is a very small amount of clear drainage at site, no signs of discolored drainage and no evidence of pus or infection.   Area cleansed with NS.  Dr. Lovena Le reviewed and removed 1 stitch from site.  1 bandaid applied to the area, patient to leave covered today then remove bandaid and open to air starting tomorrow. Can also begin cleansing gently with soap and water tomorrow. Nothing further needed at this point.    Patient educated on signs and symptoms of infection and to notify office if any concerns develop, otherwise patient to keep regular upcoming scheduled appointments.

## 2021-11-01 NOTE — Telephone Encounter (Signed)
Device clinic apt made 11/01/21 @ 3:15.

## 2021-11-01 NOTE — Telephone Encounter (Signed)
Called patient to see if she can come in today to have incision check. No answer, LMTCB.   When patient calls back please have RN make DC apt.

## 2021-11-01 NOTE — Patient Instructions (Signed)
Keep bandaid in place until tomorrow (11/02/21) and then remove.  After today, can wash gently with soap and water and pat dry using clean cloth for washing and drying at each visit.   Monitor for signs and symptoms of infection - any redness, swelling, colored drainage or odor from wound or if wound does not continue to close up and heal on its own.  If any concerning signs develop, please notify our office as soon as possible.   Thank you for allowing [place] to care for you today.

## 2021-11-01 NOTE — Telephone Encounter (Signed)
Pt spouse states that pt has a seroma on her pacemaker implant incision, wants to know if she can come by and have someone look at it.

## 2021-11-01 NOTE — Telephone Encounter (Signed)
Patient evaluated in clinic this afternoon by this nurse.  At left abdominal PPM incision site, assessed 1 stitch end that has broken through distal left end of lateral incision line.  There is a very small amount of clear drainage at site, no signs of discolored drainage and no evidence of pus or infection.  Area cleansed with NS.  Dr. Lovena Le reviewed and removed 1 stitch from site.  1 bandaid applied to the area, patient to leave covered today then remove bandaid and open to air tomorrow. Cleanse gently with soap and water moving forward starting tomorrow. Nothing further needed at this point.  Patient educated on signs and symptoms of infection and to notify office if any concerns develop, otherwise patient to keep regular upcoming scheduled appointments.

## 2021-12-27 ENCOUNTER — Ambulatory Visit: Payer: Managed Care, Other (non HMO) | Admitting: Neurology

## 2022-01-11 ENCOUNTER — Encounter: Payer: Self-pay | Admitting: Internal Medicine

## 2022-01-11 ENCOUNTER — Ambulatory Visit: Payer: No Typology Code available for payment source | Attending: Internal Medicine | Admitting: Internal Medicine

## 2022-01-11 VITALS — BP 122/76 | HR 70 | Ht 63.0 in | Wt 187.0 lb

## 2022-01-11 DIAGNOSIS — Z950 Presence of cardiac pacemaker: Secondary | ICD-10-CM | POA: Diagnosis not present

## 2022-01-11 DIAGNOSIS — I10 Essential (primary) hypertension: Secondary | ICD-10-CM

## 2022-01-11 DIAGNOSIS — Q246 Congenital heart block: Secondary | ICD-10-CM | POA: Diagnosis not present

## 2022-01-11 LAB — CUP PACEART INCLINIC DEVICE CHECK
Battery Remaining Longevity: 78 mo
Battery Voltage: 2.98 V
Brady Statistic RA Percent Paced: 92 %
Brady Statistic RV Percent Paced: 99.95 %
Date Time Interrogation Session: 20231130161812
Implantable Lead Connection Status: 753985
Implantable Lead Connection Status: 753985
Implantable Lead Implant Date: 20070629
Implantable Lead Implant Date: 20070629
Implantable Lead Location: 753859
Implantable Lead Location: 753860
Implantable Lead Model: 511212
Implantable Lead Model: 511212
Implantable Lead Serial Number: 109900
Implantable Lead Serial Number: 109901
Implantable Pulse Generator Implant Date: 20230825
Lead Channel Impedance Value: 312.5 Ohm
Lead Channel Impedance Value: 375 Ohm
Lead Channel Pacing Threshold Amplitude: 0.75 V
Lead Channel Pacing Threshold Amplitude: 0.75 V
Lead Channel Pacing Threshold Amplitude: 0.75 V
Lead Channel Pacing Threshold Amplitude: 0.75 V
Lead Channel Pacing Threshold Pulse Width: 0.4 ms
Lead Channel Pacing Threshold Pulse Width: 0.4 ms
Lead Channel Pacing Threshold Pulse Width: 0.6 ms
Lead Channel Pacing Threshold Pulse Width: 0.6 ms
Lead Channel Sensing Intrinsic Amplitude: 0.5 mV
Lead Channel Setting Pacing Amplitude: 2 V
Lead Channel Setting Pacing Amplitude: 2.5 V
Lead Channel Setting Pacing Pulse Width: 0.6 ms
Lead Channel Setting Sensing Sensitivity: 6 mV
Pulse Gen Model: 2272
Pulse Gen Serial Number: 8099014

## 2022-01-11 NOTE — Patient Instructions (Addendum)
Medication Instructions:  Your physician recommends that you continue on your current medications as directed. Please refer to the Current Medication list given to you today.  *If you need a refill on your cardiac medications before your next appointment, please call your pharmacy*  Lab Work: None ordered.  If you have labs (blood work) drawn today and your tests are completely normal, you will receive your results only by: MyChart Message (if you have MyChart) OR A paper copy in the mail If you have any lab test that is abnormal or we need to change your treatment, we will call you to review the results.  Testing/Procedures: None ordered.  Follow-Up: At [facility], you and your health needs are our priority.  As part of our continuing mission to provide you with exceptional heart care, we have created designated Provider Care Teams.  These Care Teams include your primary Cardiologist (physician) and Advanced Practice Providers (APPs -  Physician Assistants and Nurse Practitioners) who all work together to provide you with the care you need, when you need it.  We recommend signing up for the patient portal called "MyChart".  Sign up information is provided on this After Visit Summary.  MyChart is used to connect with patients for Virtual Visits (Telemedicine).  Patients are able to view lab/test results, encounter notes, upcoming appointments, etc.  Non-urgent messages can be sent to your provider as well.   To learn more about what you can do with MyChart, go to [email or web address].    Your next appointment:   1 year(s)  The format for your next appointment:   In Person  Provider:    Francis Dowse, PA-C  Remote monitoring is used to monitor your Pacemaker from home. This monitoring reduces the number of office visits required to check your device to one time per year. It allows [place] to keep an eye on the functioning of your device to ensure it is working properly. You are  scheduled for a device check from home on 01/18/22. You may send your transmission at any time that day. If you have a wireless device, the transmission will be sent automatically. After your physician reviews your transmission, you will receive a postcard with your next transmission date.  Important Information About Sugar

## 2022-01-11 NOTE — Progress Notes (Signed)
HPI [name] returns today for followup. She is a very pleasant 61 year old woman with a history of congenital complete heart block, status post permanent pacemaker insertion. She has an epicardial system. In the interim, she has done well. She denies chest pain, shortness of breath, or syncope. Minimal peripheral edema. This is associated with prolonged upright activity. She is working full time.She denies syncope. She admits to dietary indiscretion and has gained weight.  Allergies  Allergen Reactions   Azithromycin Rash   Doxycycline Rash   Erythromycin Nausea And Vomiting   Penicillins Rash   Sulfamethoxazole-Trimethoprim Rash     Current Outpatient Medications  Medication Sig Dispense Refill   ascorbic acid (VITAMIN C) 500 MG tablet Take 500 mg by mouth daily.     aspirin EC 81 MG tablet Take 81 mg by mouth daily.     calcium carbonate (TUMS - DOSED IN MG ELEMENTAL CALCIUM) 500 MG chewable tablet Chew 2 tablets by mouth as needed for indigestion or heartburn.     Carbidopa-Levodopa ER (SINEMET CR) 25-100 MG tablet controlled release Take 1 tablet by mouth 2 (two) times daily. 60 tablet 5   cephALEXin (KEFLEX) 500 MG capsule TAKE 4 CAPSULES 1 HOUR PRIOR TO PROCEDURE. 4 capsule 2   Cholecalciferol (VITAMIN D) 50 MCG (2000 UT) tablet Take 2,000 Units by mouth daily.     cyclobenzaprine (FLEXERIL) 10 MG tablet Take 1 tablet by mouth 3 (three) times daily as needed for spasms.     diclofenac Sodium (VOLTAREN) 1 % GEL Apply 2 g topically daily as needed (pain).     diphenhydrAMINE (BENADRYL) 25 mg capsule Take 25 mg by mouth every 6 (six) hours as needed for itching or allergies.     diphenhydrAMINE (SOMINEX) 25 MG tablet Take 25 mg by mouth at bedtime as needed for sleep.     furosemide (LASIX) 20 MG tablet Take 20 mg by mouth daily.     ibuprofen (ADVIL,MOTRIN) 200 MG tablet Take 200 mg by mouth every 6 (six) hours as needed for pain.     levothyroxine (SYNTHROID, LEVOTHROID)  88 MCG tablet Take 88 mcg by mouth at bedtime.      meloxicam (MOBIC) 15 MG tablet Take 15 mg by mouth daily as needed for pain.     metoprolol (LOPRESSOR) 100 MG tablet Take 50 mg by mouth daily.     trolamine salicylate (ASPERCREME) 10 % cream Apply 1 application topically as needed for muscle pain.     No current facility-administered medications for this visit.     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   Past Surgical History:  Procedure Laterality Date   INSERT / REPLACE / REMOVE PACEMAKER     s/p PPM implant 1978 for congenital CHB    PERMANENT PACEMAKER GENERATOR CHANGE N/A 04/25/2012   Procedure: PERMANENT PACEMAKER GENERATOR CHANGE;  Surgeon: Evans Lance, MD;  Location: [facility] CATH LAB;  Service: Cardiovascular;  Laterality: N/A;   PPM GENERATOR CHANGEOUT N/A 10/06/2021   Procedure: PPM GENERATOR CHANGEOUT;  Surgeon: Evans Lance, MD;  Location: [facility] CV LAB;  Service: Cardiovascular;  Laterality: N/A;     Family  History  Problem Relation Age of Onset   Heart disease Mother    COPD Mother    Hypothyroidism Father        she knows minimal about her father/his family   Parkinson's disease Father    Heart disease Maternal Grandfather    Breast cancer Maternal Aunt      Social History   Socioeconomic History   Marital status: Married    Spouse name: Not on file   Number of children: Not on file   Years of education: Not on file   Highest education level: Not on file  Occupational History   Not on file  Tobacco Use   Smoking status: Never   Smokeless tobacco: Never  Vaping Use   Vaping Use: Never used  Substance and Sexual Activity   Alcohol use: No   Drug use: No   Sexual activity: Not on  file  Other Topics Concern   Not on file  Social History Narrative   Left handed   Social Determinants of Health   Financial Resource Strain: Not on file  Food Insecurity: Not on file  Transportation Needs: Not on file  Physical Activity: Not on file  Stress: Not on file  Social Connections: Not on file  Intimate Partner Violence: Not on file     BP 122/76   Pulse 70   Ht 5\' 3"  (1.6 m)   Wt 187 lb (84.8 kg)   LMP 11/22/2011   SpO2 96%   BMI 33.13 kg/m   Physical Exam:  Well appearing NAD HEENT: Unremarkable Neck:  No JVD, no thyromegally Lymphatics:  No adenopathy Back:  No CVA tenderness Lungs:  Clear HEART:  Regular rate rhythm, no murmurs, no rubs, no clicks Abd:  soft, positive bowel sounds, no organomegally, no rebound, no guarding Ext:  2 plus pulses, no edema, no cyanosis, no clubbing Skin:  No rashes no nodules Neuro:  CN II through XII intact, motor grossly intact  EKG  DEVICE  Normal device function.  See PaceArt for details.   Assess/Plan:  1. Complete heart block - she is stable s/p PPM insertion. 2. PPM - her device is working normally. We will recheck in several months. She has about 1.4 years to ERI 3. Obesity - we have encouraged the patient to lose weight, reduce her sodium intake and increase her activity. 4. HTN - her bp is controlled on a beta blocker and a diuretic.   01/22/2012 Keita Demarco,MD

## 2022-01-16 ENCOUNTER — Ambulatory Visit: Payer: No Typology Code available for payment source | Admitting: Neurology

## 2022-01-18 ENCOUNTER — Ambulatory Visit (INDEPENDENT_AMBULATORY_CARE_PROVIDER_SITE_OTHER): Payer: No Typology Code available for payment source

## 2022-01-18 DIAGNOSIS — I442 Atrioventricular block, complete: Secondary | ICD-10-CM | POA: Diagnosis not present

## 2022-01-18 LAB — CUP PACEART REMOTE DEVICE CHECK
Battery Remaining Longevity: 88 mo
Battery Remaining Percentage: 95.5 %
Battery Voltage: 2.98 V
Brady Statistic AP VP Percent: 64 %
Brady Statistic AP VS Percent: 1 %
Brady Statistic AS VP Percent: 36 %
Brady Statistic AS VS Percent: 1 %
Brady Statistic RA Percent Paced: 63 %
Brady Statistic RV Percent Paced: 99 %
Date Time Interrogation Session: 20231207053522
Implantable Lead Connection Status: 753985
Implantable Lead Connection Status: 753985
Implantable Lead Implant Date: 20070629
Implantable Lead Implant Date: 20070629
Implantable Lead Location: 753859
Implantable Lead Location: 753860
Implantable Lead Model: 511212
Implantable Lead Model: 511212
Implantable Lead Serial Number: 109900
Implantable Lead Serial Number: 109901
Implantable Pulse Generator Implant Date: 20230825
Lead Channel Impedance Value: 290 Ohm
Lead Channel Impedance Value: 350 Ohm
Lead Channel Pacing Threshold Amplitude: 0.75 V
Lead Channel Pacing Threshold Amplitude: 0.75 V
Lead Channel Pacing Threshold Pulse Width: 0.4 ms
Lead Channel Pacing Threshold Pulse Width: 0.6 ms
Lead Channel Sensing Intrinsic Amplitude: 1 mV
Lead Channel Sensing Intrinsic Amplitude: 7 mV
Lead Channel Setting Pacing Amplitude: 2 V
Lead Channel Setting Pacing Amplitude: 2.5 V
Lead Channel Setting Pacing Pulse Width: 0.6 ms
Lead Channel Setting Sensing Sensitivity: 6 mV
Pulse Gen Model: 2272
Pulse Gen Serial Number: 8099014

## 2022-02-09 NOTE — Progress Notes (Signed)
Remote pacemaker transmission.   

## 2022-02-14 ENCOUNTER — Ambulatory Visit: Payer: No Typology Code available for payment source | Admitting: Neurology

## 2022-02-19 ENCOUNTER — Ambulatory Visit: Payer: No Typology Code available for payment source | Admitting: Neurology

## 2022-02-19 ENCOUNTER — Encounter: Payer: Self-pay | Admitting: Neurology

## 2022-02-19 VITALS — BP 144/68 | HR 62 | Ht 63.75 in | Wt 183.2 lb

## 2022-02-19 DIAGNOSIS — G20C Parkinsonism, unspecified: Secondary | ICD-10-CM

## 2022-02-19 MED ORDER — CARBIDOPA-LEVODOPA ER 25-100 MG PO TBCR: 1.0000 | EXTENDED_RELEASE_TABLET | Freq: Two times a day (BID) | ORAL | 5 refills | Status: DC

## 2022-02-19 NOTE — Patient Instructions (Signed)
Please remember to take your Sinemet/levodopa without your meals.  The protein in your meals can interfere with absorption of your levodopa.  If you would like to increase your levodopa to 1 pill 3 times daily, please let us know.  I have renewed your prescription for 1 pill twice daily for now.  Please continue to use your cane at all times, stay proactive about constipation issues and try to exercise on her regular basis.  Follow-up in 6 months to see one of our nurse practitioners.

## 2022-02-19 NOTE — Progress Notes (Signed)
Subjective:    Patient ID: [name] is a 62 y.o. female.  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  The patient is unaccompanied today. She missed an appointment on 01/16/2022. I last saw her on 05/18/2021, at which time she reported feeling stable.  We talked about her DaTscan results.  She was advised to start Sinemet with gradual titration.  She saw Veronica Austin, [name] in the interim on 09/21/2021, at which time she reported difficulty tolerating Sinemet due to increase in fatigue.  She did notice improvement of her tremor however.  She was advised to switch from Sinemet IR to Sinemet CR 25-100 mg strength 1 pill twice daily.  The patient sent a MyChart message in the interim in September 2023 reporting a fall.  She reported no injuries, other than a bruise on the left hip.  She was advised to seek immediate medical attention if she had any injuries.  Today, 02/19/22: She reports feeling fairly stable at this tim .  She had a pacemaker generator change out on 10/06/2021.  She reports having a couple of falls shortly after her pacemaker generator change.  She did bump her left rib cage and lower back but sustained no injuries thankfully, she had an x-ray through her PCP, no rib fractures thankfully.  She has been using a single-point cane, she has been in physical therapy which is helpful but does not always follow the exercises she is supposed to be doing at home.  She tries to hydrate well with water, she does drink quite a bit of caffeine about 24 to 28 ounces of soda per day, regular.  She tolerates the Sinemet CR, she takes it twice daily, around noon and midnight, does go to  bed late and gets up late around lunchtime and ate cereal for lunch but does tend to take her medication at the time.  She would be willing to change the timing of her medication, does not have as much sleepiness from it but is reluctant to increase it.  She feels like she gets by with just the twice daily dosing and feels that it is difficult to remember the medication twice daily as it is.  She has intermittent constipation issues, nothing severe, nothing she takes for this at this time.   The patient's allergies, current medications, family history, past medical history, past social history, past surgical history and problem list were reviewed and updated as appropriate.    Previously:    I first met her at the request of her primary care nurse practitioner on 04/03/2021, at which time she reported a 1+ year history of tremors, also difficulty with her balance and falls.  She has had fine motor dyscontrol.  She had evidence of parkinsonian changes, overall no telltale lateralization and findings not necessarily supportive of idiopathic Parkinson's disease, atypical parkinsonism was a possibility.  She was advised to proceed with a DaTscan.  She had a nuclear medicine brain DaTscan on 04/19/2021 and I reviewed the results: IMPRESSION: Significant decreased activity within the bilateral putamen is a pattern associated with Parkinsonian syndrome pathology.   Of note, DaTSCAN is not diagnostic of Parkinsonian syndromes, which remains a clinical diagnosis. DaTscan is an adjuvant test to aid in the  clinical diagnosis of Parkinsonian syndromes.   She was notified of the test results by phone call.    04/03/21: (She) reports a 1+ year history of intermittent hand tremors, balance issues, recurrent falls, fine motor dyscontrol, changes in her gait.  She reports that her dad had Parkinson's disease.  She has noticed her symptoms essentially after she had COVID in September 2021.  She has not tried any  medication for her tremor.  She has fallen backwards in her yard, but most of her falls have been accidental where she tripped over something on uneven ground.  She fell in 2018 and hit her forehead but did not lose consciousness, she had a laceration but repaired it herself.  She did not seek medical attention at the time.  She was vacationing at the time. I reviewed your office note from 12/19/2020. She had her first pacemaker when she was about 62 years old.  She lives with her husband, she has a history of left eye amblyopia and recently received new glasses.  She has no children.  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  She drinks caffeine, about 2-3 servings per day, rare alcohol, she is a non-smoker.  Her tremor affects both hands but seems to be worse on the left, it comes and goes, sometimes she feels that she can suppress it.  She does have a tendency to bother her feet when nervous or just by habit.  She feels that she is weaker in her muscles, does admit to not being very active and probably having some deconditioning.   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   Hypertension    Hypothyroidism    Pacemaker    Presence of permanent cardiac pacemaker    PVC (premature ventricular contraction)     Her Past Surgical History Is Significant For: Past Surgical History:  Procedure Laterality Date   INSERT / REPLACE / REMOVE PACEMAKER     s/p PPM implant 1978 for congenital CHB    PERMANENT PACEMAKER GENERATOR CHANGE N/A 04/25/2012   Procedure: PERMANENT PACEMAKER GENERATOR CHANGE;  Surgeon: Evans Lance, MD;  Location: [facility] CATH LAB;  Service: Cardiovascular;  Laterality: N/A;   PPM GENERATOR CHANGEOUT N/A 10/06/2021   Procedure: PPM GENERATOR CHANGEOUT;  Surgeon: Evans Lance, MD;  Location: [facility] CV LAB;  Service: Cardiovascular;  Laterality: N/A;    Her Family  History Is Significant For: Family History  Problem Relation Age of Onset   Heart disease Mother    COPD Mother    Hypothyroidism Father        she knows minimal about her father/his family   Parkinson's disease Father    Heart disease Maternal Grandfather    Breast cancer Maternal Aunt     Her Social History Is Significant For: Social History   Socioeconomic History   Marital status: Married    Spouse name: Not on file   Number of children: Not on file   Years of education: Not on file   Highest education level: Not on file  Occupational History   Not on file  Tobacco Use   Smoking status: Never   Smokeless tobacco: Never  Vaping Use   Vaping Use: Never used  Substance and Sexual Activity   Alcohol use: No   Drug use: No   Sexual activity: Not on file  Other Topics  Concern   Not on file  Social History Narrative   Left handed   Social Determinants of Health   Financial Resource Strain: Not on file  Food Insecurity: Not on file  Transportation Needs: Not on file  Physical Activity: Not on file  Stress: Not on file  Social Connections: Not on file    Her Allergies Are:  Allergies  Allergen Reactions   Azithromycin Rash   Doxycycline Rash   Erythromycin Nausea And Vomiting   Penicillins Rash   Sulfamethoxazole-Trimethoprim Rash  :   Her Current Medications Are:  Outpatient Encounter Medications as of 02/19/2022  Medication Sig   ascorbic acid (VITAMIN C) 500 MG tablet Take 500 mg by mouth daily.   aspirin EC 81 MG tablet Take 81 mg by mouth daily.   calcium carbonate (TUMS - DOSED IN MG ELEMENTAL CALCIUM) 500 MG chewable tablet Chew 2 tablets by mouth as needed for indigestion or heartburn.   Carbidopa-Levodopa ER (SINEMET CR) 25-100 MG tablet controlled release Take 1 tablet by mouth 2 (two) times daily.   Cholecalciferol (VITAMIN D) 50 MCG (2000 UT) tablet Take 2,000 Units by mouth daily.   cyclobenzaprine (FLEXERIL) 10 MG tablet Take 1 tablet by mouth 3  (three) times daily as needed for spasms.   diclofenac Sodium (VOLTAREN) 1 % GEL Apply 2 g topically daily as needed (pain).   diphenhydrAMINE (BENADRYL) 25 mg capsule Take 25 mg by mouth every 6 (six) hours as needed for itching or allergies.   diphenhydrAMINE (SOMINEX) 25 MG tablet Take 25 mg by mouth at bedtime as needed for sleep.   furosemide (LASIX) 20 MG tablet Take 20 mg by mouth daily.   ibuprofen (ADVIL,MOTRIN) 200 MG tablet Take 200 mg by mouth every 6 (six) hours as needed for pain.   levothyroxine (SYNTHROID, LEVOTHROID) 88 MCG tablet Take 88 mcg by mouth at bedtime.    meloxicam (MOBIC) 15 MG tablet Take 15 mg by mouth daily as needed for pain.   metoprolol (LOPRESSOR) 100 MG tablet Take 50 mg by mouth daily.   trolamine salicylate (ASPERCREME) 10 % cream Apply 1 application topically as needed for muscle pain.   cephALEXin (KEFLEX) 500 MG capsule TAKE 4 CAPSULES 1 HOUR PRIOR TO PROCEDURE. (Patient not taking: Reported on 02/19/2022)   No facility-administered encounter medications on file as of 02/19/2022.  :  Review of Systems:  Out of a complete 14 point review of systems, all are reviewed and negative with the exception of these symptoms as listed below:  Review of Systems  Neurological:        Stable.   Had some falls,  doing PT and is helping she believes.  Using cane.     Objective:  Neurological Exam  Physical Exam Physical Examination:   Vitals:   02/19/22 1252  BP: (!) 144/68  Pulse: 62  SpO2: 97%    General Examination: The patient is a very pleasant 62 y.o. female in no acute distress. She appears well-developed and well-nourished and well groomed.   HEENT: Normocephalic, atraumatic, pupils are equal, round and reactive to light, tracking is mildly impaired, mild decrease in eye blink rate.  Corrective eyeglasses in place.  Mild to moderate facial masking, mild nuchal rigidity.  Hearing grossly intact.  No carotid bruits.  Airway examination reveals mild  mouth dryness, otherwise stable findings.    Chest: Clear to auscultation without wheezing, rhonchi or crackles noted.   Heart: S1+S2+0, regular and normal without murmurs, rubs or gallops  noted.    Abdomen: Soft, non-tender and non-distended.   Extremities: There is pitting edema in the distal lower extremities bilaterally.     Skin: Warm and dry without trophic changes noted.    Musculoskeletal: exam reveals no obvious joint deformities.    Neurologically:  Mental status: The patient is awake, alert and oriented in all 4 spheres. Her immediate and remote memory, attention, language skills and fund of knowledge are appropriate. There is no evidence of aphasia, agnosia, apraxia or anomia. Speech is clear with normal prosody and enunciation. Thought process is linear. Mood is normal and affect is normal.  Cranial nerves II - XII are as described above under HEENT exam.  Motor exam: Normal bulk, strength and tone is noted, perhaps slight increase in tone in the left upper extremity, intermittent resting tremor in both upper extremities.  No significant postural tremor.  Romberg is not tested for safety concerns.   Fine motor skills and coordination: She has mild difficulty with finger taps bilaterally, mild slowness with hand movements, no obvious lateralization.   Cerebellar testing: No dysmetria or intention tremor. There is no truncal or gait ataxia.  Sensory exam: intact to light touch in the upper and lower extremities.  Gait, station and balance: She stands without assistance, posture is mildly stooped for age, she walks with decreased stride length and decreased pace, decreased arm swing bilaterally, mild dystonic posturing of the upper extremities, particularly in the hands while walking.  No walking aid.    Assessment and Plan:  In summary, [name] is a very pleasant 62 year old female with an underlying medical history of hypothyroidism, heart block with status post pacemaker  placement, history of endocarditis, arthritis, vitamin D deficiency, chronic kidney disease, hypertension, hyperlipidemia, and obesity, who presents for follow-up consultation of her parkinsonism. History and examination are not telltale for idiopathic Parkinson's disease, DDx includes atypical parkinsonism. She cannot have an MRI due to her pacemaker. Her DaTscan from 04/19/21 showed significantly decreased activity within the bilateral putamen areas, and - as such - supports an underlying parkinsonian syndrome.  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  She has fallen a few times.  She is now using a cane.  She has been in physical therapy.  She tried Sinemet IR but could not tolerate it secondary to sleepiness.  She has been on Sinemet CR 25-100 mg strength 1 pill twice daily and tolerates the medication, feels like she can get by with twice daily medication at this time and is reluctant to increase it.  We talked about the importance of fall prevention, maintaining a healthy lifestyle.  She endorses some restless leg symptoms and is advised to reduce her caffeine intake.  She is advised to use her cane at all times and continue to try to exercise regularly.  She is advised to be mindful of constipation issues and proactive by staying well-hydrated and increasing natural fiber intake in the form of fruit and vegetables.  She is advised to follow-up routinely to see one of our nurse practitioners in 6 months, sooner if needed.  I renewed her prescription.  She is encouraged to call us if she would like to try an increased dose of Sinemet CR, 1 pill 3 times daily.  I answered all her questions today and she was in agreement. I spent 30 minutes in total face-to-face time and in reviewing records during pre-charting, more than 50% of which was spent in counseling and coordination of care, reviewing  test results, reviewing medications and treatment regimen and/or in discussing or reviewing the diagnosis  of PD, the prognosis and treatment options. Pertinent laboratory and imaging test results that were available during this visit with the patient were reviewed by me and considered in my medical decision making (see chart for details).

## 2022-02-23 ENCOUNTER — Telehealth: Payer: Self-pay

## 2022-02-23 NOTE — Telephone Encounter (Signed)
Alert from [employer]  Device alert for long AT/AF episodes Presenting shows AF/AFL ongoing from 1/10 @ 09:55, overall controlled rates Burden 4%, ASA only per EPIC Route to triage [place] with patient, informed her of the increased burden of AF and the risk for stroke, and that was even more so that she is having more AF and being in the rhythm for longer amount of time, patient is adamant that she will not take a blood thinner because her blood type is such that she donated to babies.

## 2022-02-26 ENCOUNTER — Telehealth: Payer: Self-pay

## 2022-02-26 NOTE — Telephone Encounter (Signed)
The patient sent a transmission for the nurse to review. She wanted to know if she was still in A-fib. I had Sonia Baller to review it and the patient is still in A-fib. She has been in A-fib since 02/21/2022. The patient thinks the cause could be the thyroid medicine. I told the patient to consult her primary care doctor before reducing her thyroid medicine.

## 2022-03-05 ENCOUNTER — Telehealth: Payer: Self-pay

## 2022-03-05 NOTE — Telephone Encounter (Signed)
Device alert for exceed AT/AF Persistent AF, controlled rates, burden 20%, pt. has declined [place] Discontinue AF alert in [place] to triage for awareness [place]  Patient continues in ongoing Afib.  I spoke with patient again to inform her and review stroke risk with ongoing Afib.  She continues to be adamant that she will not take an [place].  Does not believe its' necessary or that the stroke risk is high despite my warning. Patient refuses to even come in and consult with doctor or Afib clinic to fully understand risks vs benefits.  States she wants to continue to be able to donate blood to babies.   [employer] wants to know if we can disable AF alerts in [place]?

## 2022-03-13 NOTE — Telephone Encounter (Signed)
Agree with stopping her atrial fib alert.

## 2022-04-19 ENCOUNTER — Ambulatory Visit: Payer: No Typology Code available for payment source

## 2022-04-19 DIAGNOSIS — I442 Atrioventricular block, complete: Secondary | ICD-10-CM

## 2022-04-19 LAB — CUP PACEART REMOTE DEVICE CHECK
Battery Remaining Longevity: 86 mo
Battery Remaining Percentage: 94 %
Battery Voltage: 2.99 V
Brady Statistic AP VP Percent: 61 %
Brady Statistic AP VS Percent: 1 %
Brady Statistic AS VP Percent: 39 %
Brady Statistic AS VS Percent: 1 %
Brady Statistic RA Percent Paced: 26 %
Brady Statistic RV Percent Paced: 99 %
Date Time Interrogation Session: 20240307020035
Implantable Lead Connection Status: 753985
Implantable Lead Connection Status: 753985
Implantable Lead Implant Date: 20070629
Implantable Lead Implant Date: 20070629
Implantable Lead Location: 753859
Implantable Lead Location: 753860
Implantable Lead Model: 511212
Implantable Lead Model: 511212
Implantable Lead Serial Number: 109900
Implantable Lead Serial Number: 109901
Implantable Pulse Generator Implant Date: 20230825
Lead Channel Impedance Value: 310 Ohm
Lead Channel Impedance Value: 350 Ohm
Lead Channel Pacing Threshold Amplitude: 0.75 V
Lead Channel Pacing Threshold Amplitude: 0.75 V
Lead Channel Pacing Threshold Pulse Width: 0.4 ms
Lead Channel Pacing Threshold Pulse Width: 0.6 ms
Lead Channel Sensing Intrinsic Amplitude: 0.5 mV
Lead Channel Sensing Intrinsic Amplitude: 7 mV
Lead Channel Setting Pacing Amplitude: 2 V
Lead Channel Setting Pacing Amplitude: 2.5 V
Lead Channel Setting Pacing Pulse Width: 0.6 ms
Lead Channel Setting Sensing Sensitivity: 6 mV
Pulse Gen Model: 2272
Pulse Gen Serial Number: 8099014

## 2022-05-09 ENCOUNTER — Encounter (INDEPENDENT_AMBULATORY_CARE_PROVIDER_SITE_OTHER): Payer: Managed Care, Other (non HMO) | Admitting: Ophthalmology

## 2022-05-22 NOTE — Progress Notes (Signed)
Remote pacemaker transmission.   

## 2022-07-19 ENCOUNTER — Ambulatory Visit (INDEPENDENT_AMBULATORY_CARE_PROVIDER_SITE_OTHER): Payer: No Typology Code available for payment source

## 2022-07-19 DIAGNOSIS — I442 Atrioventricular block, complete: Secondary | ICD-10-CM | POA: Diagnosis not present

## 2022-07-19 LAB — CUP PACEART REMOTE DEVICE CHECK
Battery Remaining Longevity: 86 mo
Battery Remaining Percentage: 91 %
Battery Voltage: 2.99 V
Brady Statistic AP VP Percent: 61 %
Brady Statistic AP VS Percent: 1 %
Brady Statistic AS VP Percent: 39 %
Brady Statistic AS VS Percent: 1 %
Brady Statistic RA Percent Paced: 13 %
Brady Statistic RV Percent Paced: 99 %
Date Time Interrogation Session: 20240606093128
Implantable Lead Connection Status: 753985
Implantable Lead Connection Status: 753985
Implantable Lead Implant Date: 20070629
Implantable Lead Implant Date: 20070629
Implantable Lead Location: 753859
Implantable Lead Location: 753860
Implantable Lead Model: 511212
Implantable Lead Model: 511212
Implantable Lead Serial Number: 109900
Implantable Lead Serial Number: 109901
Implantable Pulse Generator Implant Date: 20230825
Lead Channel Impedance Value: 310 Ohm
Lead Channel Impedance Value: 380 Ohm
Lead Channel Pacing Threshold Amplitude: 0.75 V
Lead Channel Pacing Threshold Amplitude: 0.75 V
Lead Channel Pacing Threshold Pulse Width: 0.4 ms
Lead Channel Pacing Threshold Pulse Width: 0.6 ms
Lead Channel Sensing Intrinsic Amplitude: 0.5 mV
Lead Channel Sensing Intrinsic Amplitude: 7 mV
Lead Channel Setting Pacing Amplitude: 2 V
Lead Channel Setting Pacing Amplitude: 2.5 V
Lead Channel Setting Pacing Pulse Width: 0.6 ms
Lead Channel Setting Sensing Sensitivity: 6 mV
Pulse Gen Model: 2272
Pulse Gen Serial Number: 8099014

## 2022-08-07 NOTE — Progress Notes (Unsigned)
Remote pacemaker transmission.   

## 2022-08-27 ENCOUNTER — Ambulatory Visit: Payer: No Typology Code available for payment source | Admitting: Adult Health

## 2022-08-27 ENCOUNTER — Encounter: Payer: Self-pay | Admitting: Adult Health

## 2022-08-27 VITALS — BP 137/71 | Ht 63.0 in | Wt 192.0 lb

## 2022-08-27 DIAGNOSIS — G20C Parkinsonism, unspecified: Secondary | ICD-10-CM | POA: Diagnosis not present

## 2022-08-27 MED ORDER — CARBIDOPA-LEVODOPA ER 25-100 MG PO TBCR: 1.0000 | EXTENDED_RELEASE_TABLET | Freq: Two times a day (BID) | ORAL | 11 refills | Status: DC

## 2022-08-27 NOTE — Patient Instructions (Addendum)
Continue Sinemet CR 1 tab twice daily   Continue to use your cane at all times for fall prevention      Followup in the future with me in 6 months or call earlier if needed      Thank you for coming to see [place] at [facility]. I hope we have been able to provide you high quality care today.  You may receive a patient satisfaction survey over the next few weeks. We would appreciate your feedback and comments so that we may continue to improve ourselves and the health of our patients.

## 2022-08-27 NOTE — Progress Notes (Signed)
[facility] [address] [place]. [place] [address] [phone]       OFFICE FOLLOW UP NOTE  [name] Date of Birth:  01-02-1961 Medical Record Number:  [id number]    Primary neurologist: Dr. Frances Furbish Reason for visit: Parkinsonism    SUBJECTIVE:   CHIEF COMPLAINT:  Chief Complaint  Patient presents with   Follow-up    Rm 3, here with husband Greggory Stallion.  Pt is here for follow up on Parkinson's. Pt states that the tremor is worse at her left hand when she forgets to take her meds.  Pt states that when she sits down her two middle toes on left foot, curl up. Needs refill on her carbidopa-levodopa.      HPI:   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 Frances Furbish for 1+ yr hx of tremors, fine motor incoordination, difficulty with balance and falls.  Completed DaTscan 04/2021 which showed significantly decreased activity in the bilateral putamen associated with parkinsonian syndrome pathology.  She was started on Sinemet IR on 05/18/2021 and due to difficulty tolerating with fatigue, switch to Sinemet CR 09/2021.    Update 08/27/2022 JM: She is accompanied by her husband. Has been stable since prior visit. Continues on Sinemet CR 25-100 1 tab twice daily, tolerating well. Will forget to take 2nd dose at times and will notice some worsening of her gait and tremors. When taking consistently, symptoms well controlled. Continues to ambulate with a cane, denies any recent falls.  Denies any worsening of symptoms.  Denies any new associated symptoms.    History provided for reference purposes only Update 02/19/22 Dr. Frances Furbish: She reports feeling fairly stable at this tim .  She had a pacemaker generator change out on  10/06/2021.  She reports having a couple of falls shortly after her pacemaker generator change.  She did bump her left rib cage and lower back but sustained no injuries thankfully, she had an x-ray through her PCP, no rib fractures thankfully.  She has been using a single-point cane, she has been in physical therapy which is helpful but does not always follow the exercises she is supposed to be doing at home.  She tries to hydrate well with water, she does drink quite a bit of caffeine about 24 to 28 ounces of soda per day, regular.  She tolerates the Sinemet CR, she takes it twice daily, around noon and midnight, does go to bed late and gets up late around lunchtime and ate cereal for lunch but does tend to take her medication at the time.  She would be willing to change the timing of her medication, does not have as much sleepiness from it but is reluctant to increase it.  She feels like she gets by with just the twice daily dosing and feels that it is difficult to remember the medication twice daily as it is.  She has intermittent constipation issues, nothing severe, nothing she takes for this at this time.   Update 09/21/2021 JM: Patient returns for follow-up visit after prior visit with Dr. Frances Furbish 4 months ago.  She was started on Sinemet at that time taking half tab 3 times a day and gradually working up to 1 tab 3 times a day.  She reports difficulty tolerating due to increased fatigue which she  felt even with the half tab dosing.  She does report improvement of her tremor since starting but continues to feel generalized stiffness and gait impairment.  She does have chronic right ankle stiffness but believes this is more from an old ankle injury.  Denies any new or worsening symptoms.  She does question use of a medicinal plant called Mucuna pruriens. She reports she has been reading about it online.  No further concerns at this time  05/18/2021 Dr. Frances Furbish: She reports feeling about the same.  She has read up  on Parkinsonian syndromes.  She also read her DaTscan report.  She reports that her dad had Parkinson's disease.  He was not labeled as atypical she she currently does not take her Lyrica.  She takes Flexeril only sparingly when needed for back pain.  She has not had any recent falls.  She would be willing to start levodopa therapy.  She recalls that her handwriting changed some 3 to 4 years ago even.  She frequently basis, she is supposed to have a pacemaker changed in September of this year.   The patient's allergies, current medications, family history, past medical history, past social history, past surgical history and problem list were reviewed and updated as appropriate.     04/03/21 Dr. Frances Furbish: (She) reports a 1+ year history of intermittent hand tremors, balance issues, recurrent falls, fine motor dyscontrol, changes in her gait.  She reports that her dad had Parkinson's disease.  She has noticed her symptoms essentially after she had COVID in September 2021.  She has not tried any medication for her tremor.  She has fallen backwards in her yard, but most of her falls have been accidental where she tripped over something on uneven ground.  She fell in 2018 and hit her forehead but did not lose consciousness, she had a laceration but repaired it herself.  She did not seek medical attention at the time.  She was vacationing at the time. I reviewed your office note from 12/19/2020. She had her first pacemaker when she was about 62 years old.  She lives with her husband, she has a history of left eye amblyopia and recently received new glasses.  She has no children.  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  She drinks caffeine, about 2-3 servings per day, rare alcohol, she is a non-smoker.  Her tremor affects both hands but seems to be worse on the left, it comes and goes, sometimes she feels that she can suppress it.  She does have a tendency to bother her feet when nervous or just by  habit.  She feels that she is weaker in her muscles, does admit to not being very active and probably having some deconditioning.     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   Hypertension    Hypothyroidism    Pacemaker    Presence of permanent cardiac pacemaker    PVC (premature ventricular contraction)     PSH:  Past Surgical History:  Procedure Laterality Date   INSERT / REPLACE / REMOVE PACEMAKER     s/p PPM implant 1978 for congenital CHB    PERMANENT PACEMAKER GENERATOR CHANGE N/A 04/25/2012   Procedure: PERMANENT PACEMAKER GENERATOR CHANGE;  Surgeon: Marinus Maw, MD;  Location: [facility] CATH  LAB;  Service: Cardiovascular;  Laterality: N/A;   PPM GENERATOR CHANGEOUT N/A 10/06/2021   Procedure: PPM GENERATOR CHANGEOUT;  Surgeon: Marinus Maw, MD;  Location: [facility] INVASIVE CV LAB;  Service: Cardiovascular;  Laterality: N/A;    Social History:  Social History   Socioeconomic History   Marital status: Married    Spouse name: Not on file   Number of children: Not on file   Years of education: Not on file   Highest education level: Not on file  Occupational History   Not on file  Tobacco Use   Smoking status: Never   Smokeless tobacco: Never  Vaping Use   Vaping status: Never Used  Substance and Sexual Activity   Alcohol use: No   Drug use: No   Sexual activity: Not on file  Other Topics Concern   Not on file  Social History Narrative   Left handed   Social Determinants of Health   Financial Resource Strain: Not on file  Food Insecurity: Not on file  Transportation Needs: Not on file  Physical Activity: Not on file  Stress: Not on file  Social Connections: Not on file  Intimate Partner Violence: Not on file    Family History:  Family History  Problem Relation Age of Onset   Heart disease Mother     COPD Mother    Hypothyroidism Father        she knows minimal about her father/his family   Parkinson's disease Father    Heart disease Maternal Grandfather    Breast cancer Maternal Aunt     Medications:   Current Outpatient Medications on File Prior to Visit  Medication Sig Dispense Refill   ascorbic acid (VITAMIN C) 500 MG tablet Take 500 mg by mouth daily.     aspirin EC 81 MG tablet Take 81 mg by mouth daily.     calcium carbonate (TUMS - DOSED IN MG ELEMENTAL CALCIUM) 500 MG chewable tablet Chew 2 tablets by mouth as needed for indigestion or heartburn.     Carbidopa-Levodopa ER (SINEMET CR) 25-100 MG tablet controlled release Take 1 tablet by mouth 2 (two) times daily. 60 tablet 5   cephALEXin (KEFLEX) 500 MG capsule TAKE 4 CAPSULES 1 HOUR PRIOR TO PROCEDURE. 4 capsule 2   cetirizine (ZYRTEC) 10 MG chewable tablet Chew 10 mg by mouth daily.     Cholecalciferol (VITAMIN D) 50 MCG (2000 UT) tablet Take 2,000 Units by mouth daily.     diclofenac Sodium (VOLTAREN) 1 % GEL Apply 2 g topically daily as needed (pain).     diphenhydrAMINE (BENADRYL) 25 mg capsule Take 25 mg by mouth every 6 (six) hours as needed for itching or allergies.     fluticasone (FLONASE) 50 MCG/ACT nasal spray Place into both nostrils daily.     ibuprofen (ADVIL,MOTRIN) 200 MG tablet Take 200 mg by mouth every 6 (six) hours as needed for pain.     levothyroxine (SYNTHROID, LEVOTHROID) 88 MCG tablet Take 88 mcg by mouth at bedtime.      metoprolol (LOPRESSOR) 100 MG tablet Take 50 mg by mouth daily.     trolamine salicylate (ASPERCREME) 10 % cream Apply 1 application topically as needed for muscle pain.     cyclobenzaprine (FLEXERIL) 10 MG tablet Take 1 tablet by mouth 3 (three) times daily as needed for spasms. (Patient not taking: Reported on 08/27/2022)     diphenhydrAMINE (SOMINEX) 25 MG tablet Take 25 mg by mouth at bedtime as needed for  sleep. (Patient not taking: Reported on 08/27/2022)     furosemide (LASIX)  20 MG tablet Take 20 mg by mouth daily. (Patient not taking: Reported on 08/27/2022)     meloxicam (MOBIC) 15 MG tablet Take 15 mg by mouth daily as needed for pain. (Patient not taking: Reported on 08/27/2022)     No current facility-administered medications on file prior to visit.    Allergies:   Allergies  Allergen Reactions   Azithromycin Rash   Doxycycline Rash   Erythromycin Nausea And Vomiting   Penicillins Rash   Sulfamethoxazole-Trimethoprim Rash      OBJECTIVE:  Physical Exam  Vitals:   08/27/22 1410  BP: 137/71  Weight: 192 lb (87.1 kg)  Height: 5\' 3"  (1.6 m)    Body mass index is 34.01 kg/m. No results found.   General: well developed, well nourished, pleasant middle-age Caucasian female, seated, in no evident distress HEENT: head normocephalic and atraumatic.  Mild facial masking, mild nuchal rigidity Neck: supple with no carotid or supraclavicular bruits Cardiovascular: regular rate and rhythm, no murmurs Musculoskeletal: Limited right ankle ROM 2/2 prior ankle injury Skin:  no rash/petichiae Vascular:  Normal pulses all extremities   Neurologic Exam Mental Status: Awake and fully alert.  Fluent speech and language.  Oriented to place and time. Recent and remote memory intact. Attention span, concentration and fund of knowledge appropriate. Mood and affect flat  Cranial Nerves: Pupils equal, briskly reactive to light. Extraocular movements full without nystagmus. Visual fields full to confrontation. Hearing intact. Facial sensation intact. Face, tongue, palate moves normally and symmetrically.  Motor: Normal strength in all tested extremity muscles.  Mildly increased tone RUE.  Unable to appreciate resting tremor.  Slight postural tremor of both upper extremities. Sensory.: intact to touch , pinprick , position and vibratory sensation.  Coordination: Rapid alternating movements mildly impaired in both hands. Finger-to-nose and heel-to-shin performed  accurately bilaterally. Gait and Station: Arises from chair without difficulty.  Posture is mildly stooped. Gait demonstrates adequate stride length and step height. Decreased R>L arm swing.  Mild dystonic posturing of both upper extremities.  Mild difficulty with turns.  Ambulates with cane.  Reflexes: 1+ and symmetric. Toes downgoing.      ASSESSMENT/PLAN: [name] is a 62 y.o. year old female with diagnosis of parkinsonism as evidenced on DaTscan 04/2021   Parkinsonism  -Continue Sinemet CR 25-100 BID - refill provided  -discussed importance of taking twice daily. Advised to call office with any worsening symptoms  -Advised use of cane at all times for fall prevention  -Encouraged routine physical activity and exercise as long as safety permits     Follow-up in 6 months or earlier if needed    CC:  PCP: Linus Galas, NP    I spent 26 minutes of face-to-face and non-face-to-face time with patient and husband.  This included previsit chart review, lab review, study review, order entry, electronic health record documentation, patient education regarding above diagnoses, current treatment plan and future treatment plan and answered all other questions to patient satisfaction   Ihor Austin, [facility]  [facility] [address] [place], [place] [address]  Phone [phone] Fax [phone] Note: This document was prepared with digital dictation and possible smart phrase technology. Any transcriptional errors that result from this process are unintentional.

## 2022-09-07 ENCOUNTER — Other Ambulatory Visit: Payer: Self-pay | Admitting: Internal Medicine

## 2022-10-18 ENCOUNTER — Ambulatory Visit (INDEPENDENT_AMBULATORY_CARE_PROVIDER_SITE_OTHER): Payer: No Typology Code available for payment source

## 2022-10-18 DIAGNOSIS — I442 Atrioventricular block, complete: Secondary | ICD-10-CM

## 2022-10-18 LAB — CUP PACEART REMOTE DEVICE CHECK
Battery Remaining Longevity: 82 mo
Battery Remaining Percentage: 88 %
Battery Voltage: 2.99 V
Brady Statistic AP VP Percent: 61 %
Brady Statistic AP VS Percent: 1 %
Brady Statistic AS VP Percent: 39 %
Brady Statistic AS VS Percent: 1 %
Brady Statistic RA Percent Paced: 9 %
Brady Statistic RV Percent Paced: 99 %
Date Time Interrogation Session: 20240905123713
Implantable Lead Connection Status: 753985
Implantable Lead Connection Status: 753985
Implantable Lead Implant Date: 20070629
Implantable Lead Implant Date: 20070629
Implantable Lead Location: 753859
Implantable Lead Location: 753860
Implantable Lead Model: 511212
Implantable Lead Model: 511212
Implantable Lead Serial Number: 109900
Implantable Lead Serial Number: 109901
Implantable Pulse Generator Implant Date: 20230825
Lead Channel Impedance Value: 310 Ohm
Lead Channel Impedance Value: 340 Ohm
Lead Channel Pacing Threshold Amplitude: 0.75 V
Lead Channel Pacing Threshold Amplitude: 0.75 V
Lead Channel Pacing Threshold Pulse Width: 0.4 ms
Lead Channel Pacing Threshold Pulse Width: 0.6 ms
Lead Channel Sensing Intrinsic Amplitude: 0.5 mV
Lead Channel Sensing Intrinsic Amplitude: 7 mV
Lead Channel Setting Pacing Amplitude: 2 V
Lead Channel Setting Pacing Amplitude: 2.5 V
Lead Channel Setting Pacing Pulse Width: 0.6 ms
Lead Channel Setting Sensing Sensitivity: 6 mV
Pulse Gen Model: 2272
Pulse Gen Serial Number: 8099014

## 2022-10-23 NOTE — Progress Notes (Signed)
Remote pacemaker transmission.   

## 2023-01-17 ENCOUNTER — Ambulatory Visit (INDEPENDENT_AMBULATORY_CARE_PROVIDER_SITE_OTHER): Payer: No Typology Code available for payment source

## 2023-01-17 DIAGNOSIS — I442 Atrioventricular block, complete: Secondary | ICD-10-CM | POA: Diagnosis not present

## 2023-01-17 LAB — CUP PACEART REMOTE DEVICE CHECK
Battery Remaining Longevity: 79 mo
Battery Remaining Percentage: 85 %
Battery Voltage: 2.98 V
Brady Statistic AP VP Percent: 61 %
Brady Statistic AP VS Percent: 1 %
Brady Statistic AS VP Percent: 39 %
Brady Statistic AS VS Percent: 1 %
Brady Statistic RA Percent Paced: 6.8 %
Brady Statistic RV Percent Paced: 99 %
Date Time Interrogation Session: 20241205061539
Implantable Lead Connection Status: 753985
Implantable Lead Connection Status: 753985
Implantable Lead Implant Date: 20070629
Implantable Lead Implant Date: 20070629
Implantable Lead Location: 753859
Implantable Lead Location: 753860
Implantable Lead Model: 511212
Implantable Lead Model: 511212
Implantable Lead Serial Number: 109900
Implantable Lead Serial Number: 109901
Implantable Pulse Generator Implant Date: 20230825
Lead Channel Impedance Value: 310 Ohm
Lead Channel Impedance Value: 350 Ohm
Lead Channel Pacing Threshold Amplitude: 0.75 V
Lead Channel Pacing Threshold Amplitude: 0.75 V
Lead Channel Pacing Threshold Pulse Width: 0.4 ms
Lead Channel Pacing Threshold Pulse Width: 0.6 ms
Lead Channel Sensing Intrinsic Amplitude: 0.5 mV
Lead Channel Sensing Intrinsic Amplitude: 7 mV
Lead Channel Setting Pacing Amplitude: 2 V
Lead Channel Setting Pacing Amplitude: 2.5 V
Lead Channel Setting Pacing Pulse Width: 0.6 ms
Lead Channel Setting Sensing Sensitivity: 6 mV
Pulse Gen Model: 2272
Pulse Gen Serial Number: 8099014

## 2023-02-07 ENCOUNTER — Ambulatory Visit: Payer: No Typology Code available for payment source | Admitting: Physician Assistant

## 2023-02-28 NOTE — Progress Notes (Signed)
[facility] [address] [place]. [place] [id number] [phone]       OFFICE FOLLOW UP NOTE  Ms. LETECIA TOLENTO Date of Birth:  1961-01-08 Medical Record Number:  [id number]    Primary neurologist: Dr. Frances Furbish Reason for visit: Parkinsonism    SUBJECTIVE:   CHIEF COMPLAINT:  Chief Complaint  Patient presents with   Tremors    Rm 3 with spouse Greggory Stallion Pt is well, reports she is having some imbalance. Tremor is stable. No new concerns.     HPI:   Ms. 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 Frances Furbish for 1+ yr hx of tremors, fine motor incoordination, difficulty with balance and falls.  Completed DaTscan 04/2021 which showed significantly decreased activity in the bilateral putamen associated with parkinsonian syndrome pathology.  She was started on Sinemet IR on 05/18/2021 and due to difficulty tolerating with fatigue, switched to Sinemet CR 09/2021.    Update 03/04/2023 JM: Patient returns for 63-month follow-up visit accompanied by her husband. Continues on Sinemet CR 25-100mg  1 tab twice daily, tolerating without side effects.  Has noticed greater difficulty with standing back up from a bending position and feels some heaviness in her right leg. She has also been experiencing right knee, hip and lower back pain over the past couple of weeks, has been using Flexeril and lidocaine ointment with benefit.  Ambulates with 1 crutch, helps to stand from seated position, no recent falls.  Tremors have been stable.  Does admit to being sedentary during the colder months.     History provided for reference purposes only Update 08/27/2022 JM: She is accompanied by her husband. Has been stable since prior visit. Continues on  Sinemet CR 25-100 1 tab twice daily, tolerating well. Will forget to take 2nd dose at times and will notice some worsening of her gait and tremors. When taking consistently, symptoms well controlled. Continues to ambulate with a cane, denies any recent falls.  Denies any worsening of symptoms.  Denies any new associated symptoms.  Update 02/19/22 Dr. Frances Furbish: She reports feeling fairly stable at this tim .  She had a pacemaker generator change out on 10/06/2021.  She reports having a couple of falls shortly after her pacemaker generator change.  She did bump her left rib cage and lower back but sustained no injuries thankfully, she had an x-ray through her PCP, no rib fractures thankfully.  She has been using a single-point cane, she has been in physical therapy which is helpful but does not always follow the exercises she is supposed to be doing at home.  She tries to hydrate well with water, she does drink quite a bit of caffeine about 24 to 28 ounces of soda per day, regular.  She tolerates the Sinemet CR, she takes it twice daily, around noon and midnight, does go to bed late and gets up late around lunchtime and ate cereal for lunch but does tend to take her medication at the time.  She would be willing to change the timing of her medication, does not have as much sleepiness from it but is reluctant to increase it.  She feels like she gets by with just the twice daily dosing and feels that it is difficult to remember the medication twice daily as it is.  She has intermittent  constipation issues, nothing severe, nothing she takes for this at this time.   Update 09/21/2021 JM: Patient returns for follow-up visit after prior visit with Dr. Frances Furbish 4 months ago.  She was started on Sinemet at that time taking half tab 3 times a day and gradually working up to 1 tab 3 times a day.  She reports difficulty tolerating due to increased fatigue which she felt even with the half tab dosing.  She does report improvement of her  tremor since starting but continues to feel generalized stiffness and gait impairment.  She does have chronic right ankle stiffness but believes this is more from an old ankle injury.  Denies any new or worsening symptoms.  She does question use of a medicinal plant called Mucuna pruriens. She reports she has been reading about it online.  No further concerns at this time  05/18/2021 Dr. Frances Furbish: She reports feeling about the same.  She has read up on Parkinsonian syndromes.  She also read her DaTscan report.  She reports that her dad had Parkinson's disease.  He was not labeled as atypical she she currently does not take her Lyrica.  She takes Flexeril only sparingly when needed for back pain.  She has not had any recent falls.  She would be willing to start levodopa therapy.  She recalls that her handwriting changed some 3 to 4 years ago even.  She frequently basis, she is supposed to have a pacemaker changed in September of this year.   The patient's allergies, current medications, family history, past medical history, past social history, past surgical history and problem list were reviewed and updated as appropriate.     04/03/21 Dr. Frances Furbish: (She) reports a 1+ year history of intermittent hand tremors, balance issues, recurrent falls, fine motor dyscontrol, changes in her gait.  She reports that her dad had Parkinson's disease.  She has noticed her symptoms essentially after she had COVID in September 2021.  She has not tried any medication for her tremor.  She has fallen backwards in her yard, but most of her falls have been accidental where she tripped over something on uneven ground.  She fell in 2018 and hit her forehead but did not lose consciousness, she had a laceration but repaired it herself.  She did not seek medical attention at the time.  She was vacationing at the time. I reviewed your office note from 12/19/2020. She had her first pacemaker when she was about 63 years old.  She lives with her  husband, she has a history of left eye amblyopia and recently received new glasses.  She has no children.  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  She drinks caffeine, about 2-3 servings per day, rare alcohol, she is a non-smoker.  Her tremor affects both hands but seems to be worse on the left, it comes and goes, sometimes she feels that she can suppress it.  She does have a tendency to bother her feet when nervous or just by habit.  She feels that she is weaker in her muscles, does admit to not being very active and probably having some deconditioning.     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   Hypertension    Hypothyroidism    Pacemaker    Presence  of permanent cardiac pacemaker    PVC (premature ventricular contraction)     PSH:  Past Surgical History:  Procedure Laterality Date   INSERT / REPLACE / REMOVE PACEMAKER     s/p PPM implant 1978 for congenital CHB    PERMANENT PACEMAKER GENERATOR CHANGE N/A 04/25/2012   Procedure: PERMANENT PACEMAKER GENERATOR CHANGE;  Surgeon: Marinus Maw, MD;  [facility] LAB;  Service: Cardiovascular;  Laterality: N/A;   PPM GENERATOR CHANGEOUT N/A 10/06/2021   Procedure: PPM GENERATOR CHANGEOUT;  Surgeon: Marinus Maw, MD;  Location: [facility] INVASIVE CV LAB;  Service: Cardiovascular;  Laterality: N/A;    Social History:  Social History   Socioeconomic History   Marital status: Married    Spouse name: Not on file   Number of children: Not on file   Years of education: Not on file   Highest education level: Not on file  Occupational History   Not on file  Tobacco Use   Smoking status: Never   Smokeless tobacco: Never  Vaping Use   Vaping status: Never Used  Substance and Sexual Activity   Alcohol use: No   Drug use: No   Sexual activity: Not on file   Other Topics Concern   Not on file  Social History Narrative   Left handed   Social Drivers of Health   Financial Resource Strain: Not on file  Food Insecurity: Not on file  Transportation Needs: Not on file  Physical Activity: Not on file  Stress: Not on file  Social Connections: Not on file  Intimate Partner Violence: Not on file    Family History:  Family History  Problem Relation Age of Onset   Heart disease Mother    COPD Mother    Hypothyroidism Father        she knows minimal about her father/his family   Parkinson's disease Father    Heart disease Maternal Grandfather    Breast cancer Maternal Aunt     Medications:   Current Outpatient Medications on File Prior to Visit  Medication Sig Dispense Refill   ascorbic acid (VITAMIN C) 500 MG tablet Take 500 mg by mouth daily.     aspirin EC 81 MG tablet Take 81 mg by mouth daily.     calcium carbonate (TUMS - DOSED IN MG ELEMENTAL CALCIUM) 500 MG chewable tablet Chew 2 tablets by mouth as needed for indigestion or heartburn.     Carbidopa-Levodopa ER (SINEMET CR) 25-100 MG tablet controlled release Take 1 tablet by mouth 2 (two) times daily. 60 tablet 11   cephALEXin (KEFLEX) 500 MG capsule TAKE 4 CAPSULES 1 HOUR PRIOR TO PROCEDURE. 4 capsule 1   cetirizine (ZYRTEC) 10 MG chewable tablet Chew 10 mg by mouth daily.     Cholecalciferol (VITAMIN D) 50 MCG (2000 UT) tablet Take 2,000 Units by mouth daily.     cyclobenzaprine (FLEXERIL) 10 MG tablet Take 1 tablet by mouth 3 (three) times daily as needed for spasms.     diclofenac Sodium (VOLTAREN) 1 % GEL Apply 2 g topically daily as needed (pain).     diphenhydrAMINE (BENADRYL) 25 mg capsule Take 25 mg by mouth every 6 (six) hours as needed for itching or allergies.     fluticasone (FLONASE) 50 MCG/ACT nasal spray Place into both nostrils daily.     ibuprofen (ADVIL,MOTRIN) 200 MG tablet Take 200 mg by mouth every 6 (six) hours as needed for pain.     levothyroxine  (SYNTHROID, LEVOTHROID)  88 MCG tablet Take 88 mcg by mouth at bedtime.      metoprolol (LOPRESSOR) 100 MG tablet Take 50 mg by mouth daily.     trolamine salicylate (ASPERCREME) 10 % cream Apply 1 application topically as needed for muscle pain.     No current facility-administered medications on file prior to visit.    Allergies:   Allergies  Allergen Reactions   Azithromycin Rash   Doxycycline Rash   Erythromycin Nausea And Vomiting   Penicillins Rash   Sulfamethoxazole-Trimethoprim Rash      OBJECTIVE:  Physical Exam  Vitals:   03/04/23 1348  BP: 118/72  Pulse: 70  Weight: 194 lb (88 kg)  Height: 5\' 3"  (1.6 m)   Body mass index is 34.37 kg/m. No results found.  General: well developed, well nourished, pleasant middle-age Caucasian female, seated, in no evident distress HEENT: head normocephalic and atraumatic.  Mild facial masking, mild nuchal rigidity Neck: supple with no carotid or supraclavicular bruits Cardiovascular: regular rate and rhythm, no murmurs Musculoskeletal: Limited right ankle ROM 2/2 prior ankle injury Skin:  no rash/petichiae Vascular:  Normal pulses all extremities   Neurologic Exam Mental Status: Awake and fully alert.  Fluent speech and language.  Oriented to place and time. Recent and remote memory intact. Attention span, concentration and fund of knowledge appropriate. Mood and affect flat  Cranial Nerves: Pupils equal, briskly reactive to light. Extraocular movements full without nystagmus. Visual fields full to confrontation. Hearing intact. Facial sensation intact. Face, tongue, palate moves normally and symmetrically.  Motor: Normal strength in all tested extremity muscles.  Mildly increased tone RUE.  Unable to appreciate resting tremor.  Slight postural tremor of both upper extremities. Sensory.: intact to touch , pinprick , position and vibratory sensation.  Coordination: Rapid alternating movements mildly impaired in both hands.  Finger-to-nose and heel-to-shin performed accurately bilaterally. Gait and Station: Arises from chair without difficulty.  Posture is mildly stooped. Gait demonstrates adequate stride length and step height. Decreased R>L arm swing.  Mild dystonic posturing of both upper extremities.  Mild difficulty with turns.  Ambulates with 1 crutch.  Reflexes: 1+ and symmetric. Toes downgoing.         ASSESSMENT/PLAN: NABA BUONOCORE is a 64 y.o. year old female with diagnosis of parkinsonism as evidenced on DaTscan 04/2021   Parkinsonism  -Continue Sinemet CR 25-100 BID - refill provided  -c/o difficulty standing back up from bending over, would recommend avoiding this movement if able, discussed importance of routine physical activity and exercise  -Encouraged follow-up with PCP for right knee, hip and low back pain to ensure no other underlying cause of symptoms  -Currently declines interest in PT but advised to call if interested in pursuing in the future  -Advised use of AD at all times for fall prevention  -she questions use of newer infusible medications that we do not currently offer in this office. Discussed evaluation of movement disorder clinic, will call if interested in pursuing       Follow-up in 6 months or earlier if needed    CC:  PCP: Linus Galas, NP    I spent 25 minutes of face-to-face and non-face-to-face time with patient and husband.  This included previsit chart review, lab review, study review, order entry, electronic health record documentation, patient education regarding above diagnoses, current treatment plan and future treatment plan and answered all other questions to patient satisfaction  Ihor Austin, AGNP-BC  [facility] [address]  [place], [place] [address]  Phone [phone] Fax [phone] Note: This document was prepared with digital dictation and possible smart phrase technology. Any transcriptional errors that  result from this process are unintentional.

## 2023-03-04 ENCOUNTER — Ambulatory Visit: Payer: No Typology Code available for payment source | Admitting: Adult Health

## 2023-03-04 ENCOUNTER — Encounter: Payer: Self-pay | Admitting: Adult Health

## 2023-03-04 VITALS — BP 118/72 | HR 70 | Ht 63.0 in | Wt 194.0 lb

## 2023-03-04 DIAGNOSIS — G20A1 Parkinson's disease without dyskinesia, without mention of fluctuations: Secondary | ICD-10-CM

## 2023-03-04 NOTE — Patient Instructions (Addendum)
Your Plan:  Continue Sinemet CR 1 tab twice daily  Please let me know if you are interested in evaluation at a movement disorder clinic   Please let me know if you are interested in doing physical therapy   Please follow up with your PCP regarding right leg pain       Follow up in 6 months or call earlier if needed       Thank you for coming to see [place] at [facility]. I hope we have been able to provide you high quality care today.  You may receive a patient satisfaction survey over the next few weeks. We would appreciate your feedback and comments so that we may continue to improve ourselves and the health of our patients.

## 2023-04-18 ENCOUNTER — Ambulatory Visit: Payer: No Typology Code available for payment source

## 2023-04-18 DIAGNOSIS — I442 Atrioventricular block, complete: Secondary | ICD-10-CM

## 2023-04-21 LAB — CUP PACEART REMOTE DEVICE CHECK
Battery Remaining Longevity: 78 mo
Battery Remaining Percentage: 82 %
Battery Voltage: 2.98 V
Brady Statistic AP VP Percent: 61 %
Brady Statistic AP VS Percent: 1 %
Brady Statistic AS VP Percent: 39 %
Brady Statistic AS VS Percent: 1 %
Brady Statistic RA Percent Paced: 5.4 %
Brady Statistic RV Percent Paced: 99 %
Date Time Interrogation Session: 20250306085603
Implantable Lead Connection Status: 753985
Implantable Lead Connection Status: 753985
Implantable Lead Implant Date: 20070629
Implantable Lead Implant Date: 20070629
Implantable Lead Location: 753859
Implantable Lead Location: 753860
Implantable Lead Model: 511212
Implantable Lead Model: 511212
Implantable Lead Serial Number: 109900
Implantable Lead Serial Number: 109901
Implantable Pulse Generator Implant Date: 20230825
Lead Channel Impedance Value: 310 Ohm
Lead Channel Impedance Value: 380 Ohm
Lead Channel Pacing Threshold Amplitude: 0.75 V
Lead Channel Pacing Threshold Amplitude: 0.75 V
Lead Channel Pacing Threshold Pulse Width: 0.4 ms
Lead Channel Pacing Threshold Pulse Width: 0.6 ms
Lead Channel Sensing Intrinsic Amplitude: 0.5 mV
Lead Channel Sensing Intrinsic Amplitude: 7 mV
Lead Channel Setting Pacing Amplitude: 2 V
Lead Channel Setting Pacing Amplitude: 2.5 V
Lead Channel Setting Pacing Pulse Width: 0.6 ms
Lead Channel Setting Sensing Sensitivity: 6 mV
Pulse Gen Model: 2272
Pulse Gen Serial Number: 8099014

## 2023-04-23 ENCOUNTER — Encounter: Payer: Self-pay | Admitting: Internal Medicine

## 2023-04-30 ENCOUNTER — Ambulatory Visit: Payer: No Typology Code available for payment source | Attending: Internal Medicine | Admitting: Internal Medicine

## 2023-04-30 ENCOUNTER — Encounter: Payer: Self-pay | Admitting: Internal Medicine

## 2023-04-30 VITALS — BP 130/84 | HR 70 | Ht 63.0 in | Wt 191.2 lb

## 2023-04-30 DIAGNOSIS — I4819 Other persistent atrial fibrillation: Secondary | ICD-10-CM

## 2023-04-30 DIAGNOSIS — I495 Sick sinus syndrome: Secondary | ICD-10-CM | POA: Diagnosis not present

## 2023-04-30 DIAGNOSIS — Z950 Presence of cardiac pacemaker: Secondary | ICD-10-CM | POA: Diagnosis not present

## 2023-04-30 NOTE — Patient Instructions (Addendum)
 Medication Instructions:  Your physician recommends that you continue on your current medications as directed. Please refer to the Current Medication list given to you today. *If you need a refill on your cardiac medications before your next appointment, please call your pharmacy*   Follow-Up: At [facility], you and your health needs are our priority.  As part of our continuing mission to provide you with exceptional heart care, we have created designated Provider Care Teams.  These Care Teams include your primary Cardiologist (physician) and Advanced Practice Providers (APPs -  Physician Assistants and Nurse Practitioners) who all work together to provide you with the care you need, when you need it.  We recommend signing up for the patient portal called "MyChart".  Sign up information is provided on this After Visit Summary.  MyChart is used to connect with patients for Virtual Visits (Telemedicine).  Patients are able to view lab/test results, encounter notes, upcoming appointments, etc.  Non-urgent messages can be sent to your provider as well.   To learn more about what you can do with MyChart, go to [email or web address].    Your next appointment:   1 year(s)  Provider:   Dr Nobie Putnam

## 2023-04-30 NOTE — Progress Notes (Signed)
 HPI [name] returns today for followup. She is a very pleasant 63 year old woman with a history of congenital complete heart block, status post permanent pacemaker insertion. She has an epicardial system. In the interim, she has been stable. She denies chest pain, shortness of breath, or syncope. Minimal peripheral edema. This is associated with prolonged upright activity. She is no longer working. She has been diagnosed with Parkinson's. She denies syncope. She admits to dietary indiscretion and has gained weight. Her Parkinsons makes it hard to exercise.  Allergies  Allergen Reactions   Azithromycin Rash   Doxycycline Rash   Erythromycin Nausea And Vomiting   Penicillins Rash   Sulfamethoxazole-Trimethoprim Rash     Current Outpatient Medications  Medication Sig Dispense Refill   ascorbic acid (VITAMIN C) 500 MG tablet Take 500 mg by mouth daily.     aspirin EC 81 MG tablet Take 81 mg by mouth daily.     calcium carbonate (TUMS - DOSED IN MG ELEMENTAL CALCIUM) 500 MG chewable tablet Chew 2 tablets by mouth as needed for indigestion or heartburn.     Carbidopa-Levodopa ER (SINEMET CR) 25-100 MG tablet controlled release Take 1 tablet by mouth 2 (two) times daily. 60 tablet 11   cephALEXin (KEFLEX) 500 MG capsule TAKE 4 CAPSULES 1 HOUR PRIOR TO PROCEDURE. 4 capsule 1   cetirizine (ZYRTEC) 10 MG chewable tablet Chew 10 mg by mouth daily.     Cholecalciferol (VITAMIN D) 50 MCG (2000 UT) tablet Take 2,000 Units by mouth daily.     cyclobenzaprine (FLEXERIL) 10 MG tablet Take 1 tablet by mouth 3 (three) times daily as needed for spasms.     diclofenac Sodium (VOLTAREN) 1 % GEL Apply 2 g topically daily as needed (pain).     diphenhydrAMINE (BENADRYL) 25 mg capsule Take 25 mg by mouth every 6 (six) hours as needed for itching or allergies.     fluticasone (FLONASE) 50 MCG/ACT nasal spray Place into both nostrils daily.     ibuprofen (ADVIL,MOTRIN) 200 MG tablet Take 200 mg by mouth  every 6 (six) hours as needed for pain.     levothyroxine (SYNTHROID, LEVOTHROID) 88 MCG tablet Take 88 mcg by mouth at bedtime.      metoprolol (LOPRESSOR) 100 MG tablet Take 50 mg by mouth daily.     trolamine salicylate (ASPERCREME) 10 % cream Apply 1 application topically as needed for muscle pain.     No current facility-administered medications for this visit.     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   Past Surgical History:  Procedure Laterality Date   INSERT / REPLACE / REMOVE PACEMAKER     s/p PPM implant 1978 for congenital CHB    PERMANENT PACEMAKER GENERATOR CHANGE N/A 04/25/2012   Procedure: PERMANENT PACEMAKER GENERATOR CHANGE;  Surgeon: Marinus Maw, MD;  Location: [facility] CATH LAB;  Service: Cardiovascular;  Laterality: N/A;   PPM GENERATOR CHANGEOUT N/A 10/06/2021   Procedure: PPM GENERATOR CHANGEOUT;  Surgeon: Marinus Maw, MD;  Location: [facility] INVASIVE CV LAB;  Service: Cardiovascular;  Laterality: N/A;     Family History  Problem Relation Age of Onset  Heart disease Mother    COPD Mother    Hypothyroidism Father        she knows minimal about her father/his family   Parkinson's disease Father    Heart disease Maternal Grandfather    Breast cancer Maternal Aunt      Social History   Socioeconomic History   Marital status: Married    Spouse name: Not on file   Number of children: Not on file   Years of education: Not on file   Highest education level: Not on file  Occupational History   Not on file  Tobacco Use   Smoking status: Never   Smokeless tobacco: Never  Vaping Use   Vaping status: Never Used  Substance and Sexual Activity   Alcohol use: No   Drug use: No   Sexual activity:  Not on file  Other Topics Concern   Not on file  Social History Narrative   Left handed   Social Drivers of Health   Financial Resource Strain: Not on file  Food Insecurity: Not on file  Transportation Needs: Not on file  Physical Activity: Not on file  Stress: Not on file  Social Connections: Not on file  Intimate Partner Violence: Not on file     BP 130/84   Pulse 70   Ht 5\' 3"  (1.6 m)   Wt 191 lb 3.2 oz (86.7 kg)   LMP 11/22/2011   SpO2 95%   BMI 33.87 kg/m   Physical Exam:  stable appearing middle aged woman, NAD HEENT: Unremarkable Neck:  No JVD, no thyromegally Lymphatics:  No adenopathy Back:  No CVA tenderness Lungs:  Clear HEART:  Regular rate rhythm, no murmurs, no rubs, no clicks Abd:  soft, positive bowel sounds, no organomegally, no rebound, no guarding Ext:  2 plus pulses, no edema, no cyanosis, no clubbing Skin:  No rashes no nodules Neuro:  CN II through XII intact, motor grossly intact; very mild resting tremor  EKG - atrial fib/flutter with ventricular pacing  DEVICE  Normal device function.  See PaceArt for details.   Assess/Plan:  1. Complete heart block - she is stable s/p PPM insertion. 2. PPM - her device is working normally. We will recheck in several months. She has about 1.4 years to ERI 3. Obesity - we have encouraged the patient to lose weight, reduce her sodium intake and increase her activity. 4. HTN - her bp is controlled on a beta blocker and a diuretic. 5. Afib/flutter - she has been out of rhythm for over a year and is asymptomatic. Her CHADSVASC is less than 3.    [name] Veronica Dufner,MD

## 2023-05-14 LAB — CUP PACEART INCLINIC DEVICE CHECK
Battery Remaining Longevity: 76 mo
Battery Voltage: 2.98 V
Brady Statistic RA Percent Paced: 5.3 %
Brady Statistic RV Percent Paced: 99.95 %
Date Time Interrogation Session: 20250318163400
Implantable Lead Connection Status: 753985
Implantable Lead Connection Status: 753985
Implantable Lead Implant Date: 20070629
Implantable Lead Implant Date: 20070629
Implantable Lead Location: 753859
Implantable Lead Location: 753860
Implantable Lead Model: 511212
Implantable Lead Model: 511212
Implantable Lead Serial Number: 109900
Implantable Lead Serial Number: 109901
Implantable Pulse Generator Implant Date: 20230825
Lead Channel Impedance Value: 312.5 Ohm
Lead Channel Impedance Value: 325 Ohm
Lead Channel Pacing Threshold Amplitude: 0 V
Lead Channel Pacing Threshold Amplitude: 0.75 V
Lead Channel Pacing Threshold Amplitude: 0.75 V
Lead Channel Pacing Threshold Pulse Width: 0.4 ms
Lead Channel Pacing Threshold Pulse Width: 0.6 ms
Lead Channel Pacing Threshold Pulse Width: 0.6 ms
Lead Channel Sensing Intrinsic Amplitude: 0.5 mV
Lead Channel Setting Pacing Amplitude: 2.5 V
Lead Channel Setting Pacing Pulse Width: 0.6 ms
Lead Channel Setting Sensing Sensitivity: 6 mV
Pulse Gen Model: 2272
Pulse Gen Serial Number: 8099014

## 2023-05-24 NOTE — Progress Notes (Signed)
 Remote pacemaker transmission.

## 2023-05-24 NOTE — Addendum Note (Signed)
 Addended by: Elease Etienne A on: 05/24/2023 10:26 AM   Modules accepted: Orders

## 2023-07-18 ENCOUNTER — Ambulatory Visit: Payer: Self-pay | Admitting: Internal Medicine

## 2023-07-18 ENCOUNTER — Ambulatory Visit (INDEPENDENT_AMBULATORY_CARE_PROVIDER_SITE_OTHER): Payer: No Typology Code available for payment source

## 2023-07-18 DIAGNOSIS — I442 Atrioventricular block, complete: Secondary | ICD-10-CM | POA: Diagnosis not present

## 2023-07-18 LAB — CUP PACEART REMOTE DEVICE CHECK
Battery Remaining Longevity: 83 mo
Battery Remaining Percentage: 80 %
Battery Voltage: 2.99 V
Brady Statistic RV Percent Paced: 99 %
Date Time Interrogation Session: 20250605063608
Implantable Lead Connection Status: 753985
Implantable Lead Connection Status: 753985
Implantable Lead Implant Date: 20070629
Implantable Lead Implant Date: 20070629
Implantable Lead Location: 753859
Implantable Lead Location: 753860
Implantable Lead Model: 511212
Implantable Lead Model: 511212
Implantable Lead Serial Number: 109900
Implantable Lead Serial Number: 109901
Implantable Pulse Generator Implant Date: 20230825
Lead Channel Impedance Value: 330 Ohm
Lead Channel Pacing Threshold Amplitude: 0.75 V
Lead Channel Pacing Threshold Pulse Width: 0.6 ms
Lead Channel Sensing Intrinsic Amplitude: 7 mV
Lead Channel Setting Pacing Amplitude: 2.5 V
Lead Channel Setting Pacing Pulse Width: 0.6 ms
Lead Channel Setting Sensing Sensitivity: 6 mV
Pulse Gen Model: 2272
Pulse Gen Serial Number: 8099014

## 2023-08-26 ENCOUNTER — Telehealth: Payer: Self-pay | Admitting: Adult Health

## 2023-08-26 NOTE — Telephone Encounter (Signed)
 Appointment details confirmed

## 2023-09-03 NOTE — Progress Notes (Signed)
 Remote pacemaker transmission.

## 2023-09-06 ENCOUNTER — Other Ambulatory Visit: Payer: Self-pay | Admitting: Adult Health

## 2023-09-10 ENCOUNTER — Ambulatory Visit: Payer: No Typology Code available for payment source | Admitting: Adult Health

## 2023-09-10 ENCOUNTER — Encounter: Payer: Self-pay | Admitting: Adult Health

## 2023-09-10 VITALS — BP 138/71 | HR 89 | Ht 63.0 in | Wt 192.0 lb

## 2023-09-10 DIAGNOSIS — G20A1 Parkinson's disease without dyskinesia, without mention of fluctuations: Secondary | ICD-10-CM

## 2023-09-10 MED ORDER — CARBIDOPA-LEVODOPA ER 25-100 MG PO TBCR: 1.0000 | EXTENDED_RELEASE_TABLET | Freq: Two times a day (BID) | ORAL | 11 refills | Status: AC

## 2023-09-10 NOTE — Patient Instructions (Signed)
 Your Plan:  Continue Sinemet  CR 25-100mg    Would recommend trying melatonin to help with sleep - you can start at 2mg  nightly and gradually increase as needed up to a max of 12mg  nightly   Try to avoid daytime naps and increase daytime activity which can help you sleep better at night   Please follow up with your PCP for continued right ankle issues, may benefit from seeing an orthopedic specialist      Follow up in 6 months or call earlier if needed      Thank you for coming to see us  at [facility]. I hope we have been able to provide you high quality care today.  You may receive a patient satisfaction survey over the next few weeks. We would appreciate your feedback and comments so that we may continue to improve ourselves and the health of our patients.

## 2023-09-10 NOTE — Progress Notes (Signed)
 [facility] [address] [place]. [place] [address] [phone]       OFFICE FOLLOW UP NOTE  [name] Date of Birth:  1960/11/08 Medical Record Number:  [id number]    Primary neurologist: Dr. Buck Reason for visit: Parkinsonism    SUBJECTIVE:   CHIEF COMPLAINT:  Chief Complaint  Patient presents with   Tremors    Rm 3 alone Pt is well, reports ongoing R leg weakness and difficulty lifting R foot. No other concerns.     Brief HPI:   [name] is a 63 y.o. 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 Buck for 1+ yr hx of tremors, fine motor incoordination, difficulty with balance and falls.  Completed DaTscan  04/2021 which showed significantly decreased activity in the bilateral putamen associated with parkinsonian syndrome pathology.  She was started on Sinemet  IR on 05/18/2021 and due to difficulty tolerating with fatigue, switched to Sinemet  CR 09/2021.    INTERVAL HISTORY:  Update 09/10/2023 JM: Patient returns for 63-month follow-up unaccompanied.  Reports overall stable since prior visit.  Continues on Sinemet  CR 1 tab twice daily, usually takes around 3 AM and 3 PM.  Reports continued benefit with Sinemet , no significant issues with tremor.  Ambulates with cane, no recent falls. Can get tripped up occasionally with continued right foot/ankle issues from prior injury. She has not yet further discussed this with her PCP or evaluation with ortho. Use of Flexeril and diclofenac ointment as needed.  She has chronic issues with insomnia but does admit to frequent and prolonged daytime naps and limited daytime activity.  Reports previously tried melatonin but unable to recall duration of taking and has not tried in several years.  Denies issues with constipation or swallowing.    History provided for reference purposes only Update 03/04/2023 JM: Patient returns for 63-month follow-up visit accompanied by her husband. Continues on Sinemet  CR 25-100mg  1 tab twice daily, tolerating without side effects.  Has noticed greater difficulty with standing back up from a bending position and feels some heaviness in her right leg. She has also been experiencing right knee, hip and lower back pain over the past couple of weeks, has been using Flexeril and lidocaine  ointment with benefit.  Ambulates with 1 crutch, helps to stand from seated position, no recent falls.  Tremors have been stable.  Does admit to being sedentary during the colder months.  Update 08/27/2022 JM: She is accompanied by her husband. Has been stable since prior visit. Continues on Sinemet  CR 25-100 1 tab twice daily, tolerating well. Will forget to take 2nd dose at times and will notice some worsening of her gait and tremors. When taking consistently, symptoms well controlled. Continues to ambulate with a cane, denies any recent falls.  Denies any worsening of symptoms.  Denies any new associated symptoms.  Update 02/19/22 Dr. Buck: She reports feeling fairly stable at this tim .  She had a pacemaker generator change out on 10/06/2021.  She reports having a couple of falls shortly after her pacemaker generator change.  She did bump her left rib cage and lower back but sustained no injuries thankfully, she had an x-ray through her PCP, no rib fractures thankfully.  She has been using a single-point cane, she has been in physical therapy which is helpful but does not always follow  the exercises she is supposed to be doing at home.  She tries to hydrate well with water, she does drink quite a bit of caffeine about 24 to 28 ounces of soda per day, regular.  She tolerates the Sinemet  CR, she takes it twice daily, around noon and midnight, does go to bed late and gets up late around  lunchtime and ate cereal for lunch but does tend to take her medication at the time.  She would be willing to change the timing of her medication, does not have as much sleepiness from it but is reluctant to increase it.  She feels like she gets by with just the twice daily dosing and feels that it is difficult to remember the medication twice daily as it is.  She has intermittent constipation issues, nothing severe, nothing she takes for this at this time.   Update 09/21/2021 JM: Patient returns for follow-up visit after prior visit with Dr. Buck 4 months ago.  She was started on Sinemet  at that time taking half tab 3 times a day and gradually working up to 1 tab 3 times a day.  She reports difficulty tolerating due to increased fatigue which she felt even with the half tab dosing.  She does report improvement of her tremor since starting but continues to feel generalized stiffness and gait impairment.  She does have chronic right ankle stiffness but believes this is more from an old ankle injury.  Denies any new or worsening symptoms.  She does question use of a medicinal plant called Mucuna pruriens. She reports she has been reading about it online.  No further concerns at this time  05/18/2021 Dr. Buck: She reports feeling about the same.  She has read up on Parkinsonian syndromes.  She also read her DaTscan  report.  She reports that her dad had Parkinson's disease.  He was not labeled as atypical she she currently does not take her Lyrica.  She takes Flexeril only sparingly when needed for back pain.  She has not had any recent falls.  She would be willing to start levodopa  therapy.  She recalls that her handwriting changed some 3 to 4 years ago even.  She frequently basis, she is supposed to have a pacemaker changed in September of this year.   The patient's allergies, current medications, family history, past medical history, past social history, past surgical history and problem list were reviewed and  updated as appropriate.     04/03/21 Dr. Buck: (She) reports a 1+ year history of intermittent hand tremors, balance issues, recurrent falls, fine motor dyscontrol, changes in her gait.  She reports that her dad had Parkinson's disease.  She has noticed her symptoms essentially after she had COVID in September 2021.  She has not tried any medication for her tremor.  She has fallen backwards in her yard, but most of her falls have been accidental where she tripped over something on uneven ground.  She fell in 2018 and hit her forehead but did not lose consciousness, she had a laceration but repaired it herself.  She did not seek medical attention at the time.  She was vacationing at the time. I reviewed your office note from 12/19/2020. She had her first pacemaker when she was about 63 years old.  She lives with her husband, she has a history of left eye amblyopia and recently received new glasses.  She has no children.  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  She drinks caffeine, about 2-3 servings per day, rare alcohol, she is a non-smoker.  Her tremor affects both hands but seems to be worse on the left, it comes and goes, sometimes she feels that she can suppress it.  She does have a tendency to bother her feet when nervous or just by habit.  She feels that she is weaker in her muscles, does admit to not being very active and probably having some deconditioning.     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   Hypertension    Hypothyroidism    Pacemaker    Presence of permanent cardiac pacemaker    PVC (premature ventricular contraction)     PSH:  Past Surgical History:  Procedure Laterality Date   INSERT / REPLACE / REMOVE PACEMAKER     s/p PPM implant 1978 for congenital CHB    PERMANENT PACEMAKER GENERATOR CHANGE  N/A 04/25/2012   Procedure: PERMANENT PACEMAKER GENERATOR CHANGE;  Surgeon: Danelle LELON Birmingham, MD;  Location: [facility] CATH LAB;  Service: Cardiovascular;  Laterality: N/A;   PPM GENERATOR CHANGEOUT N/A 10/06/2021   Procedure: PPM GENERATOR CHANGEOUT;  Surgeon: Birmingham Danelle LELON, MD;  Location: [facility] INVASIVE CV LAB;  Service: Cardiovascular;  Laterality: N/A;    Social History:  Social History   Socioeconomic History   Marital status: Married    Spouse name: Not on file   Number of children: Not on file   Years of education: Not on file   Highest education level: Not on file  Occupational History   Not on file  Tobacco Use   Smoking status: Never   Smokeless tobacco: Never  Vaping Use   Vaping status: Never Used  Substance and Sexual Activity   Alcohol use: No   Drug use: No   Sexual activity: Not on file  Other Topics Concern   Not on file  Social History Narrative   Left handed   Social Drivers of Health   Financial Resource Strain: Not on file  Food Insecurity: Not on file  Transportation Needs: Not on file  Physical Activity: Not on file  Stress: Not on file  Social Connections: Not on file  Intimate Partner Violence: Not on file    Family History:  Family History  Problem Relation Age of Onset   Heart disease Mother    COPD Mother    Hypothyroidism Father        she knows minimal about her father/his family   Parkinson's disease Father    Heart disease Maternal Grandfather    Breast cancer Maternal Aunt     Medications:   Current Outpatient Medications on File Prior to Visit  Medication Sig Dispense Refill   ascorbic acid (VITAMIN C) 500 MG tablet Take 500 mg by mouth daily.     aspirin EC 81 MG tablet Take 81 mg by mouth daily.     cephALEXin  (KEFLEX ) 500 MG capsule TAKE 4 CAPSULES 1 HOUR PRIOR TO PROCEDURE. 4 capsule 1   cetirizine (ZYRTEC) 10 MG chewable tablet Chew 10 mg by mouth daily.     Cholecalciferol (VITAMIN D) 50 MCG (2000 UT) tablet Take 2,000 Units by  mouth daily.     cyclobenzaprine (FLEXERIL) 10 MG tablet Take 1 tablet by mouth 3 (three) times daily as needed for spasms.  diclofenac Sodium (VOLTAREN) 1 % GEL Apply 2 g topically daily as needed (pain).     diphenhydrAMINE  (BENADRYL ) 25 mg capsule Take 25 mg by mouth every 6 (six) hours as needed for itching or allergies.     fluticasone (FLONASE) 50 MCG/ACT nasal spray Place into both nostrils daily.     ibuprofen (ADVIL,MOTRIN) 200 MG tablet Take 200 mg by mouth every 6 (six) hours as needed for pain.     levothyroxine (SYNTHROID, LEVOTHROID) 88 MCG tablet Take 88 mcg by mouth at bedtime.      metoprolol (LOPRESSOR) 100 MG tablet Take 50 mg by mouth daily.     trolamine salicylate (ASPERCREME) 10 % cream Apply 1 application topically as needed for muscle pain.     No current facility-administered medications on file prior to visit.    Allergies:   Allergies  Allergen Reactions   Azithromycin Rash   Doxycycline Rash   Erythromycin Nausea And Vomiting   Penicillins Rash   Sulfamethoxazole-Trimethoprim Rash      OBJECTIVE:  Physical Exam  Vitals:   09/10/23 1341  BP: 138/71  Pulse: 89  Weight: 192 lb (87.1 kg)  Height: 5' 3 (1.6 m)   Body mass index is 34.01 kg/m. No results found.  General: well developed, well nourished, pleasant middle-age Caucasian female, seated, in no evident distress HEENT: head normocephalic and atraumatic.  Mild facial masking, mild nuchal rigidity Neck: supple with no carotid or supraclavicular bruits Cardiovascular: regular rate and rhythm, no murmurs Musculoskeletal: Limited right ankle ROM 2/2 prior ankle injury Skin:  no rash/petichiae Vascular:  Normal pulses all extremities   Neurologic Exam Mental Status: Awake and fully alert.  Fluent speech and language.  Oriented to place and time. Recent and remote memory intact. Attention span, concentration and fund of knowledge appropriate. Mood and affect flat  Cranial Nerves: Pupils  equal, briskly reactive to light. Extraocular movements full without nystagmus. Visual fields full to confrontation. Hearing intact. Facial sensation intact. Face, tongue, palate moves normally and symmetrically.  Motor: Normal strength in all tested extremity muscles.  Mildly increased tone RUE.  Intermittent mild upper extremity resting tremor.  Slight postural tremor of both upper extremities. Sensory.: intact to touch , pinprick , position and vibratory sensation.  Coordination: Rapid alternating movements mildly impaired in both hands. Finger-to-nose and heel-to-shin performed accurately bilaterally. Gait and Station: Arises from chair without difficulty.  Posture is mildly stooped. Gait demonstrates adequate stride length and step height. Decreased R>L arm swing.  Mild dystonic posturing of both upper extremities.  Mild difficulty with turns.  Ambulates with cane. Reflexes: 1+ and symmetric. Toes downgoing.         ASSESSMENT/PLAN: LETECIA ARPS is a 63 y.o. year old female with diagnosis of parkinsonism as evidenced on DaTscan  04/2021   Parkinsonism  -Continue Sinemet  CR 25-100 BID - refill provided  -Encouraged follow-up with PCP for chronic right ankle/foot pain from prior injury, may benefit from orthopedic consult  -discussed retrial of melatonin for insomnia, discussed avoidance of daytime naps and increasing daytime activity  -Currently declines interest in PT but advised to call if interested in pursuing in the future  -Advised use of AD at all times for fall prevention      Follow-up in 6 months with Dr. Buck for routine follow-up or earlier if needed    CC:  PCP: Corlis Pagan, NP    I personally spent a total of 30 minutes in the care of the patient today including  preparing to see the patient, performing a medically appropriate exam/evaluation, counseling and educating, placing orders, and documenting clinical information in the EHR.   Harlene Bogaert,  AGNP-BC  [facility] [address] [place], [place] [address]  Phone [phone] Fax [phone] Note: This document was prepared with digital dictation and possible smart phrase technology. Any transcriptional errors that result from this process are unintentional.

## 2023-09-20 IMAGING — NM NM DATSCAN
2 series · 12 of 12 positions shown · non-contrast
Comparison: None.

CLINICAL DATA: 60-year-old female with balance problems and falls.

EXAM:
NUCLEAR MEDICINE BRAIN IMAGING WITH SPECT  (DaTscan )
TECHNIQUE: SPECT images of the brain were obtained after intravenous injection
of radiopharmaceutical. 4 hour post injection imaging. Appropriate
positioning.
130 mg COLLETTI STAT given orally for thyroid blockade.
RADIOPHARMACEUTICALS:  4.8 millicuries I 123 Ioflupane

[Series 1: spect - 159 kev _(id)_cor · 4.1mm · 4.14mm/px · 6 of 128 frames shown]
[frame 11/128]
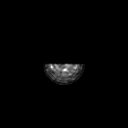
[frame 32/128]
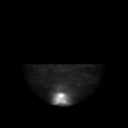
[frame 54/128]
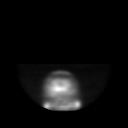
[frame 75/128]
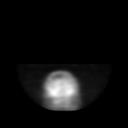
[frame 96/128]
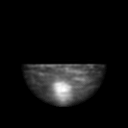
[frame 118/128]
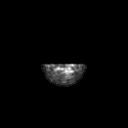

[Series 1: spect - 159 kev _(id)_tra · 4.1mm · 4.14mm/px · 6 of 128 frames shown]
[frame 11/128]
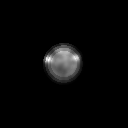
[frame 32/128]
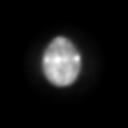
[frame 54/128]
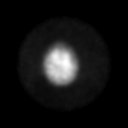
[frame 75/128]
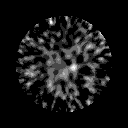
[frame 96/128]
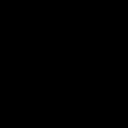
[frame 118/128]
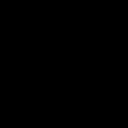

[12 of 12 positions shown; findings below may reference images not displayed]

FINDINGS: Marked decrease radiotracer activity within the posterior LEFT and
RIGHT striatum (putamen). Decreased relative activity in the head of
the LEFT caudate nucleus compared to the RIGHT.
IMPRESSION: Significant decreased activity within the bilateral putamen is a
pattern associated with Parkinsonian syndrome pathology.

Of note, DaTSCAN is not diagnostic of Parkinsonian syndromes, which
remains a clinical diagnosis. DaTscan is an adjuvant test to aid in
the clinical diagnosis of Parkinsonian syndromes.

## 2023-09-22 ENCOUNTER — Other Ambulatory Visit: Payer: Self-pay | Admitting: Internal Medicine

## 2023-09-23 ENCOUNTER — Telehealth: Payer: Self-pay | Admitting: Internal Medicine

## 2023-09-23 MED ORDER — CEPHALEXIN 500 MG PO CAPS: ORAL_CAPSULE | ORAL | 2 refills | Status: DC

## 2023-09-23 NOTE — Telephone Encounter (Signed)
 Rx(s) sent to pharmacy electronically.  Patient has been notified and thanked me for calling.

## 2023-09-23 NOTE — Telephone Encounter (Signed)
*  STAT* If patient is at the pharmacy, call can be transferred to refill team.   1. Which medications need to be refilled? (please list name of each medication and dose if known) cephALEXin  (KEFLEX ) 500 MG capsule   2. Which pharmacy/location (including street and city if local pharmacy) is medication to be sent to? [facility] [place], [place] - [address]   3. Do they need a 30 day or 90 day supply? 4 pills   States pharmacy didn't get prescription

## 2023-10-17 ENCOUNTER — Ambulatory Visit: Payer: No Typology Code available for payment source

## 2023-10-17 DIAGNOSIS — I442 Atrioventricular block, complete: Secondary | ICD-10-CM | POA: Diagnosis not present

## 2023-10-18 LAB — CUP PACEART REMOTE DEVICE CHECK
Battery Remaining Longevity: 80 mo
Battery Remaining Percentage: 78 %
Battery Voltage: 2.99 V
Brady Statistic RV Percent Paced: 99 %
Date Time Interrogation Session: 20250904051606
Implantable Lead Connection Status: 753985
Implantable Lead Connection Status: 753985
Implantable Lead Implant Date: 20070629
Implantable Lead Implant Date: 20070629
Implantable Lead Location: 753859
Implantable Lead Location: 753860
Implantable Lead Model: 511212
Implantable Lead Model: 511212
Implantable Lead Serial Number: 109900
Implantable Lead Serial Number: 109901
Implantable Pulse Generator Implant Date: 20230825
Lead Channel Impedance Value: 330 Ohm
Lead Channel Pacing Threshold Amplitude: 0.75 V
Lead Channel Pacing Threshold Pulse Width: 0.6 ms
Lead Channel Sensing Intrinsic Amplitude: 7 mV
Lead Channel Setting Pacing Amplitude: 2.5 V
Lead Channel Setting Pacing Pulse Width: 0.6 ms
Lead Channel Setting Sensing Sensitivity: 6 mV
Pulse Gen Model: 2272
Pulse Gen Serial Number: 8099014

## 2023-10-20 ENCOUNTER — Ambulatory Visit: Payer: Self-pay | Admitting: Internal Medicine

## 2023-10-26 NOTE — Progress Notes (Signed)
 Remote PPM Transmission

## 2024-01-16 ENCOUNTER — Ambulatory Visit: Payer: No Typology Code available for payment source

## 2024-01-18 LAB — CUP PACEART REMOTE DEVICE CHECK
Battery Remaining Longevity: 79 mo
Battery Remaining Percentage: 75 %
Battery Voltage: 2.99 V
Brady Statistic RV Percent Paced: 99 %
Date Time Interrogation Session: 20251204085501
Implantable Lead Connection Status: 753985
Implantable Lead Connection Status: 753985
Implantable Lead Implant Date: 20070629
Implantable Lead Implant Date: 20070629
Implantable Lead Location: 753859
Implantable Lead Location: 753860
Implantable Lead Model: 511212
Implantable Lead Model: 511212
Implantable Lead Serial Number: 109900
Implantable Lead Serial Number: 109901
Implantable Pulse Generator Implant Date: 20230825
Lead Channel Impedance Value: 340 Ohm
Lead Channel Pacing Threshold Amplitude: 0.75 V
Lead Channel Pacing Threshold Pulse Width: 0.6 ms
Lead Channel Sensing Intrinsic Amplitude: 7 mV
Lead Channel Setting Pacing Amplitude: 2.5 V
Lead Channel Setting Pacing Pulse Width: 0.6 ms
Lead Channel Setting Sensing Sensitivity: 6 mV
Pulse Gen Model: 2272
Pulse Gen Serial Number: 8099014

## 2024-01-19 ENCOUNTER — Ambulatory Visit: Payer: Self-pay | Admitting: Internal Medicine

## 2024-01-22 NOTE — Progress Notes (Signed)
 Remote PPM Transmission

## 2024-02-20 ENCOUNTER — Other Ambulatory Visit: Payer: Self-pay | Admitting: Internal Medicine

## 2024-03-12 ENCOUNTER — Telehealth: Payer: Self-pay | Admitting: Neurology

## 2024-03-12 NOTE — Telephone Encounter (Signed)
 Appointment R/S due to weather

## 2024-03-16 ENCOUNTER — Ambulatory Visit: Admitting: Neurology

## 2024-04-16 ENCOUNTER — Ambulatory Visit: Payer: No Typology Code available for payment source

## 2024-05-27 ENCOUNTER — Ambulatory Visit: Admitting: Neurology
